# Patient Record
Sex: Female | Born: 1990
Health system: Southern US, Community
[De-identification: ages and names within clinical notes are randomized; demographics above are authoritative.]

## PROBLEM LIST (undated history)

## (undated) DIAGNOSIS — Z8742 Personal history of other diseases of the female genital tract: Secondary | ICD-10-CM

## (undated) DIAGNOSIS — N760 Acute vaginitis: Secondary | ICD-10-CM

## (undated) DIAGNOSIS — T7840XA Allergy, unspecified, initial encounter: Secondary | ICD-10-CM

## (undated) DIAGNOSIS — B9689 Other specified bacterial agents as the cause of diseases classified elsewhere: Secondary | ICD-10-CM

## (undated) HISTORY — DX: Other specified bacterial agents as the cause of diseases classified elsewhere: B96.89

## (undated) HISTORY — DX: Allergy, unspecified, initial encounter: T78.40XA

## (undated) HISTORY — DX: Other specified bacterial agents as the cause of diseases classified elsewhere: N76.0

## (undated) HISTORY — DX: Personal history of other diseases of the female genital tract: Z87.42

---

## 2018-05-07 DIAGNOSIS — Z23 Encounter for immunization: Secondary | ICD-10-CM | POA: Diagnosis not present

## 2018-06-05 DIAGNOSIS — F900 Attention-deficit hyperactivity disorder, predominantly inattentive type: Secondary | ICD-10-CM | POA: Diagnosis not present

## 2018-06-26 DIAGNOSIS — F900 Attention-deficit hyperactivity disorder, predominantly inattentive type: Secondary | ICD-10-CM | POA: Diagnosis not present

## 2018-10-09 DIAGNOSIS — H04122 Dry eye syndrome of left lacrimal gland: Secondary | ICD-10-CM | POA: Diagnosis not present

## 2018-10-15 DIAGNOSIS — R5383 Other fatigue: Secondary | ICD-10-CM | POA: Diagnosis not present

## 2018-10-15 DIAGNOSIS — F900 Attention-deficit hyperactivity disorder, predominantly inattentive type: Secondary | ICD-10-CM | POA: Diagnosis not present

## 2018-10-15 DIAGNOSIS — Z13220 Encounter for screening for lipoid disorders: Secondary | ICD-10-CM | POA: Diagnosis not present

## 2018-10-15 DIAGNOSIS — Z131 Encounter for screening for diabetes mellitus: Secondary | ICD-10-CM | POA: Diagnosis not present

## 2018-10-15 DIAGNOSIS — Z0000 Encounter for general adult medical examination without abnormal findings: Secondary | ICD-10-CM | POA: Diagnosis not present

## 2018-10-15 DIAGNOSIS — Z111 Encounter for screening for respiratory tuberculosis: Secondary | ICD-10-CM | POA: Diagnosis not present

## 2018-10-17 DIAGNOSIS — M542 Cervicalgia: Secondary | ICD-10-CM | POA: Diagnosis not present

## 2019-02-26 DIAGNOSIS — B001 Herpesviral vesicular dermatitis: Secondary | ICD-10-CM | POA: Diagnosis not present

## 2019-02-26 DIAGNOSIS — F900 Attention-deficit hyperactivity disorder, predominantly inattentive type: Secondary | ICD-10-CM | POA: Diagnosis not present

## 2019-03-07 DIAGNOSIS — Z23 Encounter for immunization: Secondary | ICD-10-CM | POA: Diagnosis not present

## 2019-04-04 DIAGNOSIS — Z23 Encounter for immunization: Secondary | ICD-10-CM | POA: Diagnosis not present

## 2019-04-21 ENCOUNTER — Other Ambulatory Visit: Payer: Self-pay | Admitting: Student

## 2019-04-21 ENCOUNTER — Other Ambulatory Visit (HOSPITAL_COMMUNITY): Payer: Self-pay | Admitting: Student

## 2019-04-21 DIAGNOSIS — R599 Enlarged lymph nodes, unspecified: Secondary | ICD-10-CM | POA: Diagnosis not present

## 2019-04-27 ENCOUNTER — Other Ambulatory Visit: Payer: Self-pay

## 2019-04-27 ENCOUNTER — Ambulatory Visit
Admission: RE | Admit: 2019-04-27 | Discharge: 2019-04-27 | Disposition: A | Discharge status: Home / Self Care | Payer: BC Managed Care – PPO | Source: Ambulatory Visit | Attending: Student | Admitting: Student

## 2019-04-27 DIAGNOSIS — R590 Localized enlarged lymph nodes: Secondary | ICD-10-CM | POA: Insufficient documentation

## 2019-04-27 DIAGNOSIS — R599 Enlarged lymph nodes, unspecified: Secondary | ICD-10-CM | POA: Insufficient documentation

## 2019-04-29 DIAGNOSIS — D225 Melanocytic nevi of trunk: Secondary | ICD-10-CM | POA: Diagnosis not present

## 2019-04-29 DIAGNOSIS — D2271 Melanocytic nevi of right lower limb, including hip: Secondary | ICD-10-CM | POA: Diagnosis not present

## 2019-04-29 DIAGNOSIS — D2372 Other benign neoplasm of skin of left lower limb, including hip: Secondary | ICD-10-CM | POA: Diagnosis not present

## 2019-04-29 DIAGNOSIS — D2272 Melanocytic nevi of left lower limb, including hip: Secondary | ICD-10-CM | POA: Diagnosis not present

## 2019-04-29 DIAGNOSIS — Z808 Family history of malignant neoplasm of other organs or systems: Secondary | ICD-10-CM | POA: Diagnosis not present

## 2019-04-29 DIAGNOSIS — D485 Neoplasm of uncertain behavior of skin: Secondary | ICD-10-CM | POA: Diagnosis not present

## 2019-04-29 DIAGNOSIS — L905 Scar conditions and fibrosis of skin: Secondary | ICD-10-CM | POA: Diagnosis not present

## 2019-05-05 DIAGNOSIS — F439 Reaction to severe stress, unspecified: Secondary | ICD-10-CM | POA: Diagnosis not present

## 2019-05-05 DIAGNOSIS — R071 Chest pain on breathing: Secondary | ICD-10-CM | POA: Diagnosis not present

## 2019-06-18 DIAGNOSIS — D2271 Melanocytic nevi of right lower limb, including hip: Secondary | ICD-10-CM | POA: Diagnosis not present

## 2019-10-29 ENCOUNTER — Encounter: Payer: Self-pay | Admitting: Family Medicine

## 2019-10-29 ENCOUNTER — Ambulatory Visit (INDEPENDENT_AMBULATORY_CARE_PROVIDER_SITE_OTHER): Payer: BLUE CROSS/BLUE SHIELD | Admitting: Family Medicine

## 2019-10-29 DIAGNOSIS — L237 Allergic contact dermatitis due to plants, except food: Secondary | ICD-10-CM | POA: Diagnosis not present

## 2019-10-29 DIAGNOSIS — Z975 Presence of (intrauterine) contraceptive device: Secondary | ICD-10-CM | POA: Diagnosis not present

## 2019-10-29 DIAGNOSIS — F900 Attention-deficit hyperactivity disorder, predominantly inattentive type: Secondary | ICD-10-CM | POA: Diagnosis not present

## 2019-10-29 DIAGNOSIS — Z8719 Personal history of other diseases of the digestive system: Secondary | ICD-10-CM | POA: Insufficient documentation

## 2019-10-29 DIAGNOSIS — Z8669 Personal history of other diseases of the nervous system and sense organs: Secondary | ICD-10-CM | POA: Insufficient documentation

## 2019-10-29 DIAGNOSIS — L259 Unspecified contact dermatitis, unspecified cause: Secondary | ICD-10-CM | POA: Insufficient documentation

## 2019-10-29 DIAGNOSIS — R590 Localized enlarged lymph nodes: Secondary | ICD-10-CM

## 2019-10-29 MED ORDER — PREDNISONE 10 MG PO TABS: 10.0000 mg | ORAL_TABLET | Freq: Every day | ORAL | 0 refills | Status: DC

## 2019-10-29 NOTE — Assessment & Plan Note (Signed)
Will start prednisone taper She may continue topical cortisone as well.  Follow up if not resolving.

## 2019-10-29 NOTE — Assessment & Plan Note (Signed)
She has concerns of weight gain and changes in migraine patterns related to current IUD.  Would like to have paragard She will schedule with GYN to discuss this.

## 2019-10-29 NOTE — Progress Notes (Signed)
Julie Brandt - 29 y.o. female MRN [id number]  Date of birth: 1990/06/17  Subjective Chief Complaint  Patient presents with  . Establish Care    HPI Julie Brandt is a 29 y.o. female here today for initial visit.  She has been in fairly good health.  She has a few concerns today including rash, problems with IUD, and ADHD.   She has had rash for about 1.5-2 week.  Located on L lateral leg as well as on arms.  She noticed after going hiking.  Initially has some bumps and vesicular appearing lesions.  Rash is very itchy.  She has tried cortisone cream and benadryl without much improvement.    She has [place] IUD placed a couple of years ago.  She is concerned about weight gain since having this placed.  She also has noticed that she is having aura of migraines several days per month but is not having headaches.  She thinks she may want to change to paragard.   Her ADHD is managed with adderall 10mg  daily as needed.  She doesn't always take each day.  She has been on medication since she was a child. Previously managed by primary care provider in [place].  She has not had side effects related to medication. She does not need refill at this time.   ROS:  A comprehensive ROS was completed and negative except as noted per HPI    Allergies  Allergen Reactions  . Cephalosporins Hives and Rash    No cross reactions with penicillins    Past Medical History:  Diagnosis Date  . BV (bacterial vaginosis)   . Hx of abnormal cervical Papanicolaou smear     History reviewed. No pertinent surgical history.  Social History   Socioeconomic History  . Marital status: Single    Spouse name: Not on file  . Number of children: Not on file  . Years of education: Not on file  . Highest education level: Not on file  Occupational History  . Occupation: [employer]  Tobacco Use  . Smoking status: Never Smoker  . Smokeless tobacco: Never Used  Vaping Use  . Vaping Use: Never used   Substance and Sexual Activity  . Alcohol use: Not on file  . Drug use: Never  . Sexual activity: Yes    Partners: Male  Other Topics Concern  . Not on file  Social History Narrative  . Not on file   Social Determinants of Health   Financial Resource Strain:   . Difficulty of Paying Living Expenses: Not on file  Food Insecurity:   . Worried About Doctor, general practice in the Last Year: Not on file  . Ran Out of Food in the Last Year: Not on file  Transportation Needs:   . Lack of Transportation (Medical): Not on file  . Lack of Transportation (Non-Medical): Not on file  Physical Activity:   . Days of Exercise per Week: Not on file  . Minutes of Exercise per Session: Not on file  Stress:   . Feeling of Stress : Not on file  Social Connections:   . Frequency of Communication with Friends and Family: Not on file  . Frequency of Social Gatherings with Friends and Family: Not on file  . Attends Religious Services: Not on file  . Active Member of Clubs or Organizations: Not on file  . Attends Programme researcher, broadcasting/film/video Meetings: Not on file  . Marital Status: Not on file    Family History  Problem Relation Age of Onset  . Hypertension Mother   . Skin cancer Mother   . Hypertension Maternal Grandfather   . Diabetes Maternal Grandfather   . Stroke Maternal Grandfather   . Skin cancer Maternal Grandfather   . Prostate cancer Maternal Grandfather   . Lung cancer Paternal Grandmother     Health Maintenance  Topic Date Due  . Hepatitis C Screening  Never done  . HIV Screening  Never done  . PAP SMEAR-Modifier  Never done  . INFLUENZA VACCINE  Never done  . TETANUS/TDAP  08/24/2027  . COVID-19 Vaccine  Completed     ----------------------------------------------------------------------------------------------------------------------------------------------------------------------------------------------------------------- Physical Exam BP 130/81 (BP Location: Left Arm, Patient  Position: Sitting, Cuff Size: Normal)   Pulse 78   Temp 98.6 F (37 C) (Oral)   Ht 5' 2.21" (1.58 m)   Wt 226 lb 12.8 oz (102.9 kg)   LMP 10/15/2019 (LMP Unknown)   SpO2 99%   BMI 41.21 kg/m   Physical Exam Constitutional:      Appearance: Normal appearance.  HENT:     Head: Normocephalic and atraumatic.  Eyes:     General: No scleral icterus. Cardiovascular:     Rate and Rhythm: Normal rate and regular rhythm.  Pulmonary:     Effort: Pulmonary effort is normal.     Breath sounds: Normal breath sounds.  Musculoskeletal:     Cervical back: Neck supple.  Skin:    Comments: Excoriated rash on L lower extremity   Neurological:     General: No focal deficit present.     Mental Status: She is alert.  Psychiatric:        Mood and Affect: Mood normal.        Behavior: Behavior normal.     ------------------------------------------------------------------------------------------------------------------------------------------------------------------------------------------------------------------- Assessment and Plan  Contact dermatitis Will start prednisone taper She may continue topical cortisone as well.  Follow up if not resolving.    Attention deficit hyperactivity disorder (ADHD), predominantly inattentive type She is doing well with adderall 10mg  daily as needed.  Discussed that I can take over prescribing of this.  Previous records and PDMP reviewed.    IUD (intrauterine device) in place She has concerns of weight gain and changes in migraine patterns related to current IUD.  Would like to have paragard She will schedule with GYN to discuss this.    Supraclavicular lymphadenopathy Minimally enlarged, non tender. Previous without concerning features.     Meds ordered this encounter  Medications  . predniSONE (DELTASONE) 10 MG tablet    Sig: Take 1 tablet (10 mg total) by mouth daily with breakfast. Take 40mg  x3 days, 30mg  x3 days, 20mg  x3 days, 10mg  x3  days, 5mg  x3 days.    Dispense:  32 tablet    Refill:  0    No follow-ups on file.    This visit occurred during the SARS-CoV-2 public health emergency.  Safety protocols were in place, including screening questions prior to the visit, additional usage of staff PPE, and extensive cleaning of exam room while observing appropriate contact time as indicated for disinfecting solutions.

## 2019-10-29 NOTE — Assessment & Plan Note (Signed)
Minimally enlarged, non tender. Previous US without concerning features.

## 2019-10-29 NOTE — Patient Instructions (Signed)
Great to meet you today! Take prednisone taper as directed for rash.  Let me know if not resolving.  Let me know when you need refill of adderall.  I'll need to see you every 3 months for this.

## 2019-10-29 NOTE — Assessment & Plan Note (Signed)
She is doing well with adderall 10mg  daily as needed.  Discussed that I can take over prescribing of this.  Previous records and PDMP reviewed.

## 2019-11-20 ENCOUNTER — Other Ambulatory Visit (HOSPITAL_COMMUNITY)
Admission: RE | Admit: 2019-11-20 | Discharge: 2019-11-20 | Disposition: A | Discharge status: Home / Self Care | Payer: BLUE CROSS/BLUE SHIELD | Source: Ambulatory Visit | Attending: Certified Nurse Midwife | Admitting: Certified Nurse Midwife

## 2019-11-20 ENCOUNTER — Ambulatory Visit (INDEPENDENT_AMBULATORY_CARE_PROVIDER_SITE_OTHER): Payer: BLUE CROSS/BLUE SHIELD | Admitting: Certified Nurse Midwife

## 2019-11-20 ENCOUNTER — Other Ambulatory Visit: Payer: Self-pay

## 2019-11-20 ENCOUNTER — Encounter: Payer: Self-pay | Admitting: Certified Nurse Midwife

## 2019-11-20 VITALS — BP 133/82 | HR 91 | Ht 62.0 in | Wt 233.0 lb

## 2019-11-20 DIAGNOSIS — Z975 Presence of (intrauterine) contraceptive device: Secondary | ICD-10-CM

## 2019-11-20 DIAGNOSIS — Z01419 Encounter for gynecological examination (general) (routine) without abnormal findings: Secondary | ICD-10-CM | POA: Diagnosis not present

## 2019-11-20 NOTE — Progress Notes (Signed)
Gynecology Annual Exam   History of Present Illness: Julie Brandt is a 29 y.o. single female presenting for an annual exam. She is concerned her IUD is causing some unwanted sx. Reports weight gain since insertion despite diet and exercise. Also reports aura w/o migraine around 13-15th of every month. States this was happening while on OCPs but had a migraine with it then. She did not have these sx while off birth control. Also concerned that recently dx papilledema may be associated with her IUD. [place] IUD was placed 09/2017. She is sexually active. Same partner 2 years. Declines STD testing. She denies dyspareunia. She does perform self breast exams. There is no notable family history of breast or ovarian cancer in her family. Hx of abnormal pap and colpo, had 2 normal paps since.   Past Medical History:  Past Medical History:  Diagnosis Date  . BV (bacterial vaginosis)   . Hx of abnormal cervical Papanicolaou smear     Past Surgical History:  History reviewed. No pertinent surgical history.  Gynecologic History:  LMP: Patient's last menstrual period was 10/15/2019 (lmp unknown). Average Interval: regular Heavy Menses: no Clots: no Intermenstrual Bleeding: no Postcoital Bleeding: no Dysmenorrhea: no Contraception: IUD Last Pap: completed on 09/2017 ; result was: NIL and HR HPV negative  Mammogram: n/a  Obstetric History: [id number]  Family History:  Family History  Problem Relation Age of Onset  . Hypertension Mother   . Skin cancer Mother   . Hypertension Maternal Grandfather   . Diabetes Maternal Grandfather   . Stroke Maternal Grandfather   . Skin cancer Maternal Grandfather   . Prostate cancer Maternal Grandfather   . Lung cancer Paternal Grandmother   . Parkinson's disease Father 37    Social History:  Social History   Socioeconomic History  . Marital status: Single    Spouse name: Not on file  . Number of children: Not on file  . Years of education: Not on file  .  Highest education level: Not on file  Occupational History  . Occupation: Doctor, general practice  Tobacco Use  . Smoking status: Never Smoker  . Smokeless tobacco: Never Used  Vaping Use  . Vaping Use: Never used  Substance and Sexual Activity  . Alcohol use: Not on file  . Drug use: Never  . Sexual activity: Yes    Partners: Male    Birth control/protection: I.U.D.  Other Topics Concern  . Not on file  Social History Narrative  . Not on file   Social Determinants of Health   Financial Resource Strain:   . Difficulty of Paying Living Expenses: Not on file  Food Insecurity:   . Worried About Programme researcher, broadcasting/film/video in the Last Year: Not on file  . Ran Out of Food in the Last Year: Not on file  Transportation Needs:   . Lack of Transportation (Medical): Not on file  . Lack of Transportation (Non-Medical): Not on file  Physical Activity:   . Days of Exercise per Week: Not on file  . Minutes of Exercise per Session: Not on file  Stress:   . Feeling of Stress : Not on file  Social Connections:   . Frequency of Communication with Friends and Family: Not on file  . Frequency of Social Gatherings with Friends and Family: Not on file  . Attends Religious Services: Not on file  . Active Member of Clubs or Organizations: Not on file  . Attends Banker Meetings: Not on file  .  Marital Status: Not on file  Intimate Partner Violence:   . Fear of Current or Ex-Partner: Not on file  . Emotionally Abused: Not on file  . Physically Abused: Not on file  . Sexually Abused: Not on file    Allergies:  Allergies  Allergen Reactions  . Cephalosporins Hives and Rash    No cross reactions with penicillins    Medications: Prior to Admission medications   Medication Sig Start Date End Date Taking? Authorizing Provider  amphetamine-dextroamphetamine (ADDERALL) 10 MG tablet Take by mouth. 02/26/19  Yes [provider]  cetirizine (ZYRTEC) 10 MG tablet Take 10 mg by mouth  daily.   Yes [provider]  levonorgestrel Rutha Bouchard) 19.5 MG IUD  08/27/17  Yes [provider]  diphenhydrAMINE (BENADRYL ALLERGY) 25 MG tablet     [provider]  Multiple Vitamin (MULTI-VITAMIN) tablet Take by mouth. Patient not taking: Reported on 11/20/2019    [provider]  predniSONE (DELTASONE) 10 MG tablet Take 1 tablet (10 mg total) by mouth daily with breakfast. Take 40mg  x3 days, 30mg  x3 days, 20mg  x3 days, 10mg  x3 days, 5mg  x3 days. Patient not taking: Reported on 11/20/2019 10/29/19   , DO    Review of Systems: negative except noted in HPI  Physical Exam Vitals: BP 133/82   Pulse 91   Ht 5\' 2"  (1.575 m)   Wt 233 lb (105.7 kg)   LMP 10/15/2019 (LMP Unknown)   BMI 42.62 kg/m  General: NAD HEENT: normocephalic, atraumatic Thyroid: no enlargement, no palpable nodules Pulmonary: Normal rate and effort, CTAB Cardiovascular: RRR Breast: Breast symmetrical, no tenderness, no palpable nodules or masses, no skin or nipple retraction present, no nipple discharge. No axillary or supraclavicular lymphadenopathy. Abdomen: soft, non-tender, non-distended. No hepatomegaly, splenomegaly or masses palpable. No evidence of hernia  Genitourinary:  External: Normal external female genitalia. Normal urethral meatus  Vagina: Normal vaginal mucosa, no evidence of prolapse   Cervix: Grossly normal in appearance, no bleeding, IUD string seen  Uterus: Non-enlarged, mobile, normal contour. No CMT  Adnexa: non-enlarged, no masses  Rectal: deferred Extremities: no edema, erythema, or tenderness Neurologic: Grossly intact Psychiatric: mood appropriate, affect full  Female chaperone present for pelvic and breast portions of the physical exam  Assessment:  1. Well woman exam   2. IUD (intrauterine device) in place   - counseled that her sx unlikely caused by IUD - she is interested in Paragard IUD; discussed mechanism of action, may increase  menstrual flow and pain - she will call back to schedule if she desires changing out IUD  Plan: Papsmear today Follow up with GYN in 1 year or prn Follow up with PCP as scheduled  , CNM 11/20/2019 10:19 AM

## 2019-11-20 NOTE — Progress Notes (Signed)
Pt has concerns about IUD

## 2019-11-21 DIAGNOSIS — R0981 Nasal congestion: Secondary | ICD-10-CM | POA: Diagnosis not present

## 2019-11-21 DIAGNOSIS — R5383 Other fatigue: Secondary | ICD-10-CM | POA: Diagnosis not present

## 2019-11-21 DIAGNOSIS — R519 Headache, unspecified: Secondary | ICD-10-CM | POA: Diagnosis not present

## 2019-11-23 LAB — CYTOLOGY - PAP
Comment: NEGATIVE
Diagnosis: NEGATIVE
High risk HPV: NEGATIVE

## 2019-12-14 ENCOUNTER — Encounter: Payer: BLUE CROSS/BLUE SHIELD | Admitting: Family Medicine

## 2019-12-17 ENCOUNTER — Other Ambulatory Visit: Payer: Self-pay

## 2019-12-17 ENCOUNTER — Ambulatory Visit (INDEPENDENT_AMBULATORY_CARE_PROVIDER_SITE_OTHER): Payer: BLUE CROSS/BLUE SHIELD | Admitting: Family Medicine

## 2019-12-17 ENCOUNTER — Encounter: Payer: Self-pay | Admitting: Family Medicine

## 2019-12-17 VITALS — BP 121/75 | HR 88 | Temp 97.9°F | Ht 61.81 in | Wt 235.3 lb

## 2019-12-17 DIAGNOSIS — Z111 Encounter for screening for respiratory tuberculosis: Secondary | ICD-10-CM | POA: Diagnosis not present

## 2019-12-17 DIAGNOSIS — Z23 Encounter for immunization: Secondary | ICD-10-CM | POA: Diagnosis not present

## 2019-12-17 DIAGNOSIS — Z13220 Encounter for screening for lipoid disorders: Secondary | ICD-10-CM

## 2019-12-17 DIAGNOSIS — Z0000 Encounter for general adult medical examination without abnormal findings: Secondary | ICD-10-CM | POA: Diagnosis not present

## 2019-12-17 NOTE — Assessment & Plan Note (Signed)
Well adult Orders Placed This Encounter  Procedures  . Flu Vaccine QUAD 6+ mos PF IM (Fluarix Quad PF)  . COMPLETE METABOLIC PANEL WITH GFR  . CBC  . Lipid Profile  . QuantiFERON-TB Gold Plus  Screening: TB, Lipid. Immunizations: flu vaccine Anticipatory guidance/Risk factor reduction:  Recommendations per AVS.

## 2019-12-17 NOTE — Progress Notes (Signed)
Julie Brandt - 29 y.o. female MRN [id number]  Date of birth: Mar 08, 1990  Subjective Chief Complaint  Patient presents with  . Annual Exam    HPI Julie Brandt is a 29 y.o. female here today for annual exam.  She has been in fairly good health.  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  She needs updated TB test.   She would like to have routine labs tests as well.   She is a non-smoker.  She has 1 glass of wine each night.    She walks some for exercise.   She had well woman exam with updated pap recently.    She will get flu vaccine today.  Has toenail that she hit about a year ago that she wants me to look at to be sure it doesn't appear to have fungal infection.     Review of Systems  Constitutional: Negative for chills, fever, malaise/fatigue and weight loss.  HENT: Negative for congestion, ear pain and sore throat.   Eyes: Negative for blurred vision, double vision and pain.  Respiratory: Negative for cough and shortness of breath.   Cardiovascular: Negative for chest pain and palpitations.  Gastrointestinal: Negative for abdominal pain, blood in stool, constipation, heartburn and nausea.  Genitourinary: Negative for dysuria and urgency.  Musculoskeletal: Negative for joint pain and myalgias.  Neurological: Negative for dizziness and headaches.  Endo/Heme/Allergies: Does not bruise/bleed easily.  Psychiatric/Behavioral: Negative for depression. The patient is not nervous/anxious and does not have insomnia.     Allergies  Allergen Reactions  . Cephalosporins Hives and Rash    No cross reactions with penicillins    Past Medical History:  Diagnosis Date  . BV (bacterial vaginosis)   . Hx of abnormal cervical Papanicolaou smear     History reviewed. No pertinent surgical history.  Social History   Socioeconomic History  . Marital status: Single    Spouse name: Not on file  . Number of children: Not on file  . Years of  education: Not on file  . Highest education level: Not on file  Occupational History  . Occupation: Doctor, general practice  Tobacco Use  . Smoking status: Never Smoker  . Smokeless tobacco: Never Used  Vaping Use  . Vaping Use: Never used  Substance and Sexual Activity  . Alcohol use: Not on file  . Drug use: Never  . Sexual activity: Yes    Partners: Male    Birth control/protection: I.U.D.  Other Topics Concern  . Not on file  Social History Narrative  . Not on file   Social Determinants of Health   Financial Resource Strain:   . Difficulty of Paying Living Expenses: Not on file  Food Insecurity:   . Worried About Programme researcher, broadcasting/film/video in the Last Year: Not on file  . Ran Out of Food in the Last Year: Not on file  Transportation Needs:   . Lack of Transportation (Medical): Not on file  . Lack of Transportation (Non-Medical): Not on file  Physical Activity:   . Days of Exercise per Week: Not on file  . Minutes of Exercise per Session: Not on file  Stress:   . Feeling of Stress : Not on file  Social Connections:   . Frequency of Communication with Friends and Family: Not on file  . Frequency of Social Gatherings with Friends and Family: Not on file  . Attends Religious Services: Not on file  . Active Member  of Clubs or Organizations: Not on file  . Attends Banker Meetings: Not on file  . Marital Status: Not on file    Family History  Problem Relation Age of Onset  . Hypertension Mother   . Skin cancer Mother   . Hypertension Maternal Grandfather   . Diabetes Maternal Grandfather   . Stroke Maternal Grandfather   . Skin cancer Maternal Grandfather   . Prostate cancer Maternal Grandfather   . Lung cancer Paternal Grandmother   . Parkinson's disease Father 110    Health Maintenance  Topic Date Due  . Hepatitis C Screening  Never done  . HIV Screening  Never done  . INFLUENZA VACCINE  Never done  . PAP SMEAR-Modifier  11/20/2022  . TETANUS/TDAP   08/24/2027  . COVID-19 Vaccine  Completed     ----------------------------------------------------------------------------------------------------------------------------------------------------------------------------------------------------------------- Physical Exam BP 121/75 (BP Location: Left Arm, Patient Position: Sitting, Cuff Size: Large)   Pulse 88   Temp 97.9 F (36.6 C)   Ht 5' 1.81" (1.57 m)   Wt 235 lb 4.8 oz (106.7 kg)   SpO2 97%   BMI 43.30 kg/m   Physical Exam Constitutional:      General: She is not in acute distress. HENT:     Head: Normocephalic and atraumatic.     Right Ear: Tympanic membrane normal.     Left Ear: Tympanic membrane normal.     Nose: Nose normal.  Eyes:     General: No scleral icterus.    Conjunctiva/sclera: Conjunctivae normal.  Neck:     Thyroid: No thyromegaly.  Cardiovascular:     Rate and Rhythm: Normal rate and regular rhythm.     Heart sounds: Normal heart sounds.  Pulmonary:     Effort: Pulmonary effort is normal.     Breath sounds: Normal breath sounds.  Abdominal:     General: Bowel sounds are normal. There is no distension.     Palpations: Abdomen is soft.     Tenderness: There is no abdominal tenderness. There is no guarding.  Musculoskeletal:        General: Normal range of motion.     Cervical back: Normal range of motion and neck supple.  Lymphadenopathy:     Cervical: No cervical adenopathy.  Skin:    General: Skin is warm and dry.     Findings: No rash.     Comments: Distal nail of L great nail slightly dystrophic without thickening of the nail.   Neurological:     General: No focal deficit present.     Mental Status: She is alert and oriented to person, place, and time.     Cranial Nerves: No cranial nerve deficit.     Coordination: Coordination normal.  Psychiatric:        Mood and Affect: Mood normal.        Behavior: Behavior normal.      ------------------------------------------------------------------------------------------------------------------------------------------------------------------------------------------------------------------- Assessment and Plan  Well adult exam Well adult Orders Placed This Encounter  Procedures  . Flu Vaccine QUAD 6+ mos PF IM (Fluarix Quad PF)  . COMPLETE METABOLIC PANEL WITH GFR  . CBC  . Lipid Profile  . QuantiFERON-TB Gold Plus  Screening: TB, Lipid. Immunizations: flu vaccine Anticipatory guidance/Risk factor reduction:  Recommendations per AVS.     No orders of the defined types were placed in this encounter.   No follow-ups on file.    This visit occurred during the SARS-CoV-2 public health emergency.  Safety protocols were in place, including  screening questions prior to the visit, additional usage of staff PPE, and extensive cleaning of exam room while observing appropriate contact time as indicated for disinfecting solutions.

## 2019-12-17 NOTE — Patient Instructions (Signed)
Preventive Care 21-31 Years Old, Female Preventive care refers to visits with your health care provider and lifestyle choices that can promote health and wellness. This includes:  A yearly physical exam. This may also be called an annual well check.  Regular dental visits and eye exams.  Immunizations.  Screening for certain conditions.  Healthy lifestyle choices, such as eating a healthy diet, getting regular exercise, not using drugs or products that contain nicotine and tobacco, and limiting alcohol use. What can I expect for my preventive care visit? Physical exam Your health care provider will check your:  Height and weight. This may be used to calculate body mass index (BMI), which tells if you are at a healthy weight.  Heart rate and blood pressure.  Skin for abnormal spots. Counseling Your health care provider may ask you questions about your:  Alcohol, tobacco, and drug use.  Emotional well-being.  Home and relationship well-being.  Sexual activity.  Eating habits.  Work and work environment.  Method of birth control.  Menstrual cycle.  Pregnancy history. What immunizations do I need?  Influenza (flu) vaccine  This is recommended every year. Tetanus, diphtheria, and pertussis (Tdap) vaccine  You may need a Td booster every 10 years. Varicella (chickenpox) vaccine  You may need this if you have not been vaccinated. Human papillomavirus (HPV) vaccine  If recommended by your health care provider, you may need three doses over 6 months. Measles, mumps, and rubella (MMR) vaccine  You may need at least one dose of MMR. You may also need a second dose. Meningococcal conjugate (MenACWY) vaccine  One dose is recommended if you are age 19-21 years and a first-year college student living in a residence hall, or if you have one of several medical conditions. You may also need additional booster doses. Pneumococcal conjugate (PCV13) vaccine  You may need  this if you have certain conditions and were not previously vaccinated. Pneumococcal polysaccharide (PPSV23) vaccine  You may need one or two doses if you smoke cigarettes or if you have certain conditions. Hepatitis A vaccine  You may need this if you have certain conditions or if you travel or work in places where you may be exposed to hepatitis A. Hepatitis B vaccine  You may need this if you have certain conditions or if you travel or work in places where you may be exposed to hepatitis B. Haemophilus influenzae type b (Hib) vaccine  You may need this if you have certain conditions. You may receive vaccines as individual doses or as more than one vaccine together in one shot (combination vaccines). Talk with your health care provider about the risks and benefits of combination vaccines. What tests do I need?  Blood tests  Lipid and cholesterol levels. These may be checked every 5 years starting at age 20.  Hepatitis C test.  Hepatitis B test. Screening  Diabetes screening. This is done by checking your blood sugar (glucose) after you have not eaten for a while (fasting).  Sexually transmitted disease (STD) testing.  BRCA-related cancer screening. This may be done if you have a family history of breast, ovarian, tubal, or peritoneal cancers.  Pelvic exam and Pap test. This may be done every 3 years starting at age 21. Starting at age 30, this may be done every 5 years if you have a Pap test in combination with an HPV test. Talk with your health care provider about your test results, treatment options, and if necessary, the need for more tests.   Follow these instructions at home: Eating and drinking   Eat a diet that includes fresh fruits and vegetables, whole grains, lean protein, and low-fat dairy.  Take vitamin and mineral supplements as recommended by your health care provider.  Do not drink alcohol if: ? Your health care provider tells you not to drink. ? You are  pregnant, may be pregnant, or are planning to become pregnant.  If you drink alcohol: ? Limit how much you have to 0-1 drink a day. ? Be aware of how much alcohol is in your drink. In the [place] one drink equals one 12 oz bottle of beer (355 mL), one 5 oz glass of wine (148 mL), or one 1 oz glass of hard liquor (44 mL). Lifestyle  Take daily care of your teeth and gums.  Stay active. Exercise for at least 30 minutes on 5 or more days each week.  Do not use any products that contain nicotine or tobacco, such as cigarettes, e-cigarettes, and chewing tobacco. If you need help quitting, ask your health care provider.  If you are sexually active, practice safe sex. Use a condom or other form of birth control (contraception) in order to prevent pregnancy and STIs (sexually transmitted infections). If you plan to become pregnant, see your health care provider for a preconception visit. What's next?  Visit your health care provider once a year for a well check visit.  Ask your health care provider how often you should have your eyes and teeth checked.  Stay up to date on all vaccines. This information is not intended to replace advice given to you by your health care provider. Make sure you discuss any questions you have with your health care provider. Document Revised: 10/17/2017 Document Reviewed: 10/17/2017 Elsevier Patient Education  2020 [employer].

## 2019-12-21 DIAGNOSIS — Z111 Encounter for screening for respiratory tuberculosis: Secondary | ICD-10-CM | POA: Diagnosis not present

## 2019-12-21 DIAGNOSIS — Z13220 Encounter for screening for lipoid disorders: Secondary | ICD-10-CM | POA: Diagnosis not present

## 2019-12-21 DIAGNOSIS — Z0000 Encounter for general adult medical examination without abnormal findings: Secondary | ICD-10-CM | POA: Diagnosis not present

## 2019-12-23 LAB — CBC
HCT: 38.2 % (ref 35.0–45.0)
Hemoglobin: 12.9 g/dL (ref 11.7–15.5)
MCH: 29.7 pg (ref 27.0–33.0)
MCHC: 33.8 g/dL (ref 32.0–36.0)
MCV: 88 fL (ref 80.0–100.0)
MPV: 10.9 fL (ref 7.5–12.5)
Platelets: 221 10*3/uL (ref 140–400)
RBC: 4.34 10*6/uL (ref 3.80–5.10)
RDW: 12.3 % (ref 11.0–15.0)
WBC: 4.9 10*3/uL (ref 3.8–10.8)

## 2019-12-23 LAB — COMPLETE METABOLIC PANEL WITH GFR
AG Ratio: 1.7 (calc) (ref 1.0–2.5)
ALT: 13 U/L (ref 6–29)
AST: 16 U/L (ref 10–30)
Albumin: 4 g/dL (ref 3.6–5.1)
Alkaline phosphatase (APISO): 87 U/L (ref 31–125)
BUN: 9 mg/dL (ref 7–25)
CO2: 26 mmol/L (ref 20–32)
Calcium: 9.1 mg/dL (ref 8.6–10.2)
Chloride: 105 mmol/L (ref 98–110)
Creat: 0.82 mg/dL (ref 0.50–1.10)
GFR, Est African American: 111 mL/min/{1.73_m2} (ref >=60)
GFR, Est Non African American: 96 mL/min/{1.73_m2} (ref >=60)
Globulin: 2.4 g/dL (calc) (ref 1.9–3.7)
Glucose, Bld: 94 mg/dL (ref 65–99)
Potassium: 4.1 mmol/L (ref 3.5–5.3)
Sodium: 137 mmol/L (ref 135–146)
Total Bilirubin: 0.8 mg/dL (ref 0.2–1.2)
Total Protein: 6.4 g/dL (ref 6.1–8.1)

## 2019-12-23 LAB — QUANTIFERON-TB GOLD PLUS
Mitogen-NIL: >10 IU/mL
NIL: 0.02 IU/mL
QuantiFERON-TB Gold Plus: NEGATIVE
TB1-NIL: 0 IU/mL
TB2-NIL: 0 IU/mL

## 2019-12-23 LAB — LIPID PANEL
Cholesterol: 184 mg/dL (ref <200)
HDL: 47 mg/dL — ABNORMAL LOW (ref >=50)
LDL Cholesterol (Calc): 109 mg/dL (calc) — ABNORMAL HIGH
Non-HDL Cholesterol (Calc): 137 mg/dL (calc) — ABNORMAL HIGH (ref <130)
Total CHOL/HDL Ratio: 3.9 (calc) (ref <5.0)
Triglycerides: 160 mg/dL — ABNORMAL HIGH (ref <150)

## 2020-02-04 ENCOUNTER — Encounter: Payer: Self-pay | Admitting: Family Medicine

## 2020-02-04 ENCOUNTER — Ambulatory Visit (INDEPENDENT_AMBULATORY_CARE_PROVIDER_SITE_OTHER): Payer: BLUE CROSS/BLUE SHIELD | Admitting: Family Medicine

## 2020-02-04 ENCOUNTER — Other Ambulatory Visit: Payer: Self-pay

## 2020-02-04 DIAGNOSIS — F900 Attention-deficit hyperactivity disorder, predominantly inattentive type: Secondary | ICD-10-CM | POA: Diagnosis not present

## 2020-02-04 MED ORDER — AMPHETAMINE-DEXTROAMPHETAMINE 10 MG PO TABS: 10.0000 mg | ORAL_TABLET | Freq: Every day | ORAL | 0 refills | Status: DC

## 2020-02-04 MED ORDER — AMPHETAMINE-DEXTROAMPHETAMINE 10 MG PO TABS
10.0000 mg | ORAL_TABLET | Freq: Every day | ORAL | 0 refills | Status: DC
Start: 2020-02-04 — End: 2020-04-08

## 2020-02-04 NOTE — Progress Notes (Signed)
Julie Brandt - 29 y.o. female MRN [id number]  Date of birth: 07/11/1990  Subjective Chief Complaint  Patient presents with  . Medication Refill    HPI Julie Brandt is a 29 y.o. female here today for follow up of ADHD.  She was diagnosed with ADHD when she was in 3rd grade.  Initially tried on Ritalin but this did not work for her.  Changed to adderall and she has continued on this since that time.  She has been off for about 11 months because she didn't think she needed this after completing studying for exams.  She has since found that she is having some difficulty since starting her new job.  She does note a mild jittery feeling when first taking the medication but overall is tolerating well.  She tried changing to extended release previously but had insomnia with this.    ROS:  A comprehensive ROS was completed and negative except as noted per HPI  Allergies  Allergen Reactions  . Cephalosporins Hives and Rash    No cross reactions with penicillins    Past Medical History:  Diagnosis Date  . BV (bacterial vaginosis)   . Hx of abnormal cervical Papanicolaou smear     History reviewed. No pertinent surgical history.  Social History   Socioeconomic History  . Marital status: Single    Spouse name: Not on file  . Number of children: Not on file  . Years of education: Not on file  . Highest education level: Not on file  Occupational History  . Occupation: Doctor, general practice  Tobacco Use  . Smoking status: Never Smoker  . Smokeless tobacco: Never Used  Vaping Use  . Vaping Use: Never used  Substance and Sexual Activity  . Alcohol use: Not on file  . Drug use: Never  . Sexual activity: Yes    Partners: Male    Birth control/protection: I.U.D.  Other Topics Concern  . Not on file  Social History Narrative  . Not on file   Social Determinants of Health   Financial Resource Strain: Not on file  Food Insecurity: Not on file  Transportation Needs: Not on file  Physical  Activity: Not on file  Stress: Not on file  Social Connections: Not on file    Family History  Problem Relation Age of Onset  . Hypertension Mother   . Skin cancer Mother   . Hypertension Maternal Grandfather   . Diabetes Maternal Grandfather   . Stroke Maternal Grandfather   . Skin cancer Maternal Grandfather   . Prostate cancer Maternal Grandfather   . Lung cancer Paternal Grandmother   . Parkinson's disease Father 36    Health Maintenance  Topic Date Due  . Hepatitis C Screening  Never done  . HIV Screening  Never done  . PAP SMEAR-Modifier  11/20/2022  . TETANUS/TDAP  08/24/2027  . INFLUENZA VACCINE  Completed  . COVID-19 Vaccine  Completed     ----------------------------------------------------------------------------------------------------------------------------------------------------------------------------------------------------------------- Physical Exam BP 106/77   Pulse 83   Temp 97.8 F (36.6 C)   Wt 232 lb 8 oz (105.5 kg)   SpO2 99%   BMI 42.79 kg/m   Physical Exam Constitutional:      Appearance: Normal appearance.  Eyes:     General: No scleral icterus. Neurological:     General: No focal deficit present.     Mental Status: She is alert.  Psychiatric:        Mood and Affect: Mood normal.  Behavior: Behavior normal.     ------------------------------------------------------------------------------------------------------------------------------------------------------------------------------------------------------------------- Assessment and Plan  Attention deficit hyperactivity disorder (ADHD), predominantly inattentive type She has done well with adderall 10mg  daily.  Mild side effects but tolerable.  Will restart adderall.  PDMP reviewed.  F/u in 3 months.    Meds ordered this encounter  Medications  . amphetamine-dextroamphetamine (ADDERALL) 10 MG tablet    Sig: Take 1 tablet (10 mg total) by mouth daily with breakfast.     Dispense:  30 tablet    Refill:  0    Fill now  . amphetamine-dextroamphetamine (ADDERALL) 10 MG tablet    Sig: Take 1 tablet (10 mg total) by mouth daily with breakfast.    Dispense:  30 tablet    Refill:  0    Fill in 60 days  . amphetamine-dextroamphetamine (ADDERALL) 10 MG tablet    Sig: Take 1 tablet (10 mg total) by mouth daily with breakfast. Fill in 30 days    Dispense:  30 tablet    Refill:  0    Return in about 3 months (around 05/04/2020) for ADHD.    This visit occurred during the SARS-CoV-2 public health emergency.  Safety protocols were in place, including screening questions prior to the visit, additional usage of staff PPE, and extensive cleaning of exam room while observing appropriate contact time as indicated for disinfecting solutions.

## 2020-02-04 NOTE — Assessment & Plan Note (Signed)
She has done well with adderall 10mg  daily.  Mild side effects but tolerable.  Will restart adderall.  PDMP reviewed.  F/u in 3 months.

## 2020-04-08 ENCOUNTER — Encounter: Payer: Self-pay | Admitting: Family Medicine

## 2020-04-08 ENCOUNTER — Other Ambulatory Visit: Payer: Self-pay | Admitting: Family Medicine

## 2020-04-08 MED ORDER — AMPHETAMINE-DEXTROAMPHETAMINE 10 MG PO TABS
10.0000 mg | ORAL_TABLET | Freq: Every day | ORAL | 0 refills | Status: DC
Start: 2020-04-08 — End: 2020-05-23

## 2020-05-04 ENCOUNTER — Ambulatory Visit: Payer: BLUE CROSS/BLUE SHIELD | Admitting: Family Medicine

## 2020-05-23 ENCOUNTER — Ambulatory Visit (INDEPENDENT_AMBULATORY_CARE_PROVIDER_SITE_OTHER): Payer: Managed Care, Other (non HMO) | Admitting: Family Medicine

## 2020-05-23 ENCOUNTER — Encounter: Payer: Self-pay | Admitting: Family Medicine

## 2020-05-23 VITALS — BP 141/87 | HR 92 | Temp 97.9°F | Ht 62.0 in | Wt 237.0 lb

## 2020-05-23 DIAGNOSIS — F900 Attention-deficit hyperactivity disorder, predominantly inattentive type: Secondary | ICD-10-CM | POA: Diagnosis not present

## 2020-05-23 DIAGNOSIS — F411 Generalized anxiety disorder: Secondary | ICD-10-CM

## 2020-05-23 MED ORDER — AMPHETAMINE-DEXTROAMPHET ER 10 MG PO CP24: 10.0000 mg | ORAL_CAPSULE | Freq: Every day | ORAL | 0 refills | Status: DC

## 2020-05-23 NOTE — Progress Notes (Signed)
Julie Brandt - 30 y.o. female MRN [id number]  Date of birth: February 05, 1991  Subjective Chief Complaint  Patient presents with  . Anxiety  . Depression    HPI  Julie Brandt is a 30 y.o. female here today for follow-up of ADD.  She also has some concerns about depression and anxiety.  ADD is currently managed with Adderall 10 mg use IR once daily.  This typically last 4 to 6 hours however she feels that she could benefit more from an extended release formulation.  She does well through the first portion of her day however when it comes to charting in the afternoon and evenings she has difficulty focusing on this.  She has not noted a significant side effects from medication.  She also has noted some symptoms of depression and anxiety.  She feels like this is related to her current job however she is leaving this position within the next couple of weeks.  She is hopeful that the symptoms will improve with transitioning to a new job.  She would be interested in counseling however she is not sure she wants to pursue starting medication at this time.  Depression screen [facility] 2/9 05/23/2020 02/04/2020 12/17/2019  Decreased Interest 2 1 1   Down, Depressed, Hopeless 3 1 1   PHQ - 2 Score 5 2 2   Altered sleeping 1 0 -  Tired, decreased energy 3 1 -  Change in appetite 2 0 -  Feeling bad or failure about yourself  3 1 -  Trouble concentrating 3 0 -  Moving slowly or fidgety/restless 0 0 -  Suicidal thoughts 0 0 -  PHQ-9 Score 17 4 -  Difficult doing work/chores Very difficult Somewhat difficult -   GAD 7 : Generalized Anxiety Score 05/23/2020 02/04/2020 10/29/2019  Nervous, Anxious, on Edge 3 2 1   Control/stop worrying 2 1 1   Worry too much - different things 3 1 1   Trouble relaxing 2 2 0  Restless 1 0 0  Easily annoyed or irritable 3 2 1   Afraid - awful might happen 3 0 1  Total GAD 7 Score 17 8 5   Anxiety Difficulty Very difficult - Somewhat difficult      Allergies  Allergen Reactions  .  Cephalosporins Hives and Rash    No cross reactions with penicillins    Past Medical History:  Diagnosis Date  . BV (bacterial vaginosis)   . Hx of abnormal cervical Papanicolaou smear     History reviewed. No pertinent surgical history.  Social History   Socioeconomic History  . Marital status: Single    Spouse name: Not on file  . Number of children: Not on file  . Years of education: Not on file  . Highest education level: Not on file  Occupational History  . Occupation: 07/23/2020  Tobacco Use  . Smoking status: Never Smoker  . Smokeless tobacco: Never Used  Vaping Use  . Vaping Use: Never used  Substance and Sexual Activity  . Alcohol use: Not on file  . Drug use: Never  . Sexual activity: Yes    Partners: Male    Birth control/protection: I.U.D.  Other Topics Concern  . Not on file  Social History Narrative  . Not on file   Social Determinants of Health   Financial Resource Strain: Not on file  Food Insecurity: Not on file  Transportation Needs: Not on file  Physical Activity: Not on file  Stress: Not on file  Social Connections: Not on file  Family History  Problem Relation Age of Onset  . Hypertension Mother   . Skin cancer Mother   . Hypertension Maternal Grandfather   . Diabetes Maternal Grandfather   . Stroke Maternal Grandfather   . Skin cancer Maternal Grandfather   . Prostate cancer Maternal Grandfather   . Lung cancer Paternal Grandmother   . Parkinson's disease Father 47    Health Maintenance  Topic Date Due  . Hepatitis C Screening  Never done  . HIV Screening  Never done  . COVID-19 Vaccine (3 - Booster for Moderna series) 10/02/2019  . INFLUENZA VACCINE  09/19/2020  . PAP SMEAR-Modifier  11/20/2022  . TETANUS/TDAP  08/24/2027  . HPV VACCINES  Aged Out      ----------------------------------------------------------------------------------------------------------------------------------------------------------------------------------------------------------------- Physical Exam BP (!) 141/87 (BP Location: Left Arm, Patient Position: Sitting, Cuff Size: Large)   Pulse 92   Temp 97.9 F (36.6 C)   Ht 5\' 2"  (1.575 m)   Wt 237 lb (107.5 kg)   SpO2 99%   BMI 43.35 kg/m   Physical Exam Constitutional:      Appearance: Normal appearance.  HENT:     Head: Normocephalic and atraumatic.  Eyes:     General: No scleral icterus. Musculoskeletal:     Cervical back: Neck supple.  Neurological:     General: No focal deficit present.     Mental Status: She is alert.  Psychiatric:        Mood and Affect: Mood normal.        Behavior: Behavior normal.     ------------------------------------------------------------------------------------------------------------------------------------------------------------------------------------------------------------------- Assessment and Plan  Attention deficit hyperactivity disorder (ADHD), predominantly inattentive type Current dose of Adderall is working fairly well for her however not lasting throughout the day.  Will change to Adderall extended release of 10 mg daily and titrate as needed.  She will let me know how she is doing with this after a couple weeks.   GAD (generalized anxiety disorder) Predominantly anxiety related symptoms with some depressive features.  She would like to hold off on medication as she feels like her current job is likely contributing.  She will be leaving this position soon.  She would be interested in seeing a therapist and I placed a referral for her to get scheduled for this.   Meds ordered this encounter  Medications  . amphetamine-dextroamphetamine (ADDERALL XR) 10 MG 24 hr capsule    Sig: Take 1 capsule (10 mg total) by mouth daily.    Dispense:  30 capsule     Refill:  0    Return in about 6 weeks (around 07/04/2020) for f/u anxiety/add.    This visit occurred during the SARS-CoV-2 public health emergency.  Safety protocols were in place, including screening questions prior to the visit, additional usage of staff PPE, and extensive cleaning of exam room while observing appropriate contact time as indicated for disinfecting solutions.

## 2020-05-23 NOTE — Assessment & Plan Note (Signed)
Current dose of Adderall is working fairly well for her however not lasting throughout the day.  Will change to Adderall extended release of 10 mg daily and titrate as needed.  She will let me know how she is doing with this after a couple weeks.

## 2020-05-23 NOTE — Assessment & Plan Note (Signed)
Predominantly anxiety related symptoms with some depressive features.  She would like to hold off on medication as she feels like her current job is likely contributing.  She will be leaving this position soon.  She would be interested in seeing a therapist and I placed a referral for her to get scheduled for this.

## 2020-05-23 NOTE — Patient Instructions (Signed)
Try the adderall XR at 10mg  daily.  Let's follow up in about 6 weeks for anxiety and medication follow up.

## 2020-07-04 ENCOUNTER — Encounter: Payer: Self-pay | Admitting: Family Medicine

## 2020-07-04 ENCOUNTER — Other Ambulatory Visit: Payer: Self-pay

## 2020-07-04 ENCOUNTER — Ambulatory Visit: Payer: Managed Care, Other (non HMO) | Admitting: Family Medicine

## 2020-07-04 DIAGNOSIS — F411 Generalized anxiety disorder: Secondary | ICD-10-CM

## 2020-07-04 DIAGNOSIS — F900 Attention-deficit hyperactivity disorder, predominantly inattentive type: Secondary | ICD-10-CM | POA: Diagnosis not present

## 2020-07-04 NOTE — Progress Notes (Signed)
Julie Brandt - 30 y.o. female MRN [id number]  Date of birth: 11-Sep-1990  Subjective Chief Complaint  Patient presents with  . Anxiety    HPI  Julie Brandt is a 30 y.o. female here today for follow up of ADD and anxiety.  She was changed from adderall IR to adderall xr at previous appointment which she feels works better.  She is transitioning between jobs right now and really isn't taking the medication during this time.  She has not noted any significant side effects related to the medication including palpitations, anxiety, or sleep difficulty.  She actually feels that this has improved her anxiety some.  She still doesn't think she is at a point where she would like to try medication for anxiety.  Referral placed for a therapist but hasn't scheduled yet.   Depression screen [facility] 2/9 07/04/2020 05/23/2020 02/04/2020  Decreased Interest 1 2 1   Down, Depressed, Hopeless 1 3 1   PHQ - 2 Score 2 5 2   Altered sleeping 2 1 0  Tired, decreased energy 2 3 1   Change in appetite 0 2 0  Feeling bad or failure about yourself  2 3 1   Trouble concentrating 1 3 0  Moving slowly or fidgety/restless 0 0 0  Suicidal thoughts 0 0 0  PHQ-9 Score 9 17 4   Difficult doing work/chores Somewhat difficult Very difficult Somewhat difficult   GAD 7 : Generalized Anxiety Score 07/04/2020 05/23/2020 02/04/2020 10/29/2019  Nervous, Anxious, on Edge 2 3 2 1   Control/stop worrying 1 2 1 1   Worry too much - different things 2 3 1 1   Trouble relaxing 2 2 2  0  Restless 1 1 0 0  Easily annoyed or irritable 3 3 2 1   Afraid - awful might happen 2 3 0 1  Total GAD 7 Score 13 17 8 5   Anxiety Difficulty Somewhat difficult Very difficult - Somewhat difficult   ROS:  A comprehensive ROS was completed and negative except as noted per HPI     Allergies  Allergen Reactions  . Cephalosporins Hives and Rash    No cross reactions with penicillins    Past Medical History:  Diagnosis Date  . BV (bacterial vaginosis)   . Hx of  abnormal cervical Papanicolaou smear     History reviewed. No pertinent surgical history.  Social History   Socioeconomic History  . Marital status: Single    Spouse name: Not on file  . Number of children: Not on file  . Years of education: Not on file  . Highest education level: Not on file  Occupational History  . Occupation:  Tobacco Use  . Smoking status: Never Smoker  . Smokeless tobacco: Never Used  Vaping Use  . Vaping Use: Never used  Substance and Sexual Activity  . Alcohol use: Not on file  . Drug use: Never  . Sexual activity: Yes    Partners: Male    Birth control/protection: I.U.D.  Other Topics Concern  . Not on file  Social History Narrative  . Not on file   Social Determinants of Health   Financial Resource Strain: Not on file  Food Insecurity: Not on file  Transportation Needs: Not on file  Physical Activity: Not on file  Stress: Not on file  Social Connections: Not on file    Family History  Problem Relation Age of Onset  . Hypertension Mother   . Skin cancer Mother   . Hypertension Maternal Grandfather   . Diabetes Maternal  Grandfather   . Stroke Maternal Grandfather   . Skin cancer Maternal Grandfather   . Prostate cancer Maternal Grandfather   . Lung cancer Paternal Grandmother   . Parkinson's disease Father 15    Health Maintenance  Topic Date Due  . HIV Screening  Never done  . Hepatitis C Screening  Never done  . COVID-19 Vaccine (3 - Booster for Moderna series) 09/01/2019  . INFLUENZA VACCINE  09/19/2020  . PAP SMEAR-Modifier  11/20/2022  . TETANUS/TDAP  08/24/2027  . HPV VACCINES  Aged Out     ----------------------------------------------------------------------------------------------------------------------------------------------------------------------------------------------------------------- Physical Exam BP 119/77 (BP Location: Left Arm, Patient Position: Sitting, Cuff Size: Large)   Pulse 77    Temp 97.6 F (36.4 C) (Oral)   Ht 5\' 2"  (1.575 m)   Wt 238 lb (108 kg)   SpO2 95%   BMI 43.53 kg/m   Physical Exam Constitutional:      Appearance: Normal appearance.  HENT:     Head: Normocephalic and atraumatic.  Musculoskeletal:     Cervical back: Neck supple.  Neurological:     Mental Status: She is alert.  Psychiatric:        Mood and Affect: Mood normal.        Behavior: Behavior normal.     ------------------------------------------------------------------------------------------------------------------------------------------------------------------------------------------------------------------- Assessment and Plan  Attention deficit hyperactivity disorder (ADHD), predominantly inattentive type She has done well with change to extended release adderall.  We'll continue at current strength.  She will let me know when she needs a refill.   GAD (generalized anxiety disorder) She would like to hold off on medication at this time but we did discuss options.  Would recommend lexapro and she will let me know if she decides she wants to try this.  She will contact therapist that we referred her to and set up an appointment.     No orders of the defined types were placed in this encounter.   No follow-ups on file.    This visit occurred during the SARS-CoV-2 public health emergency.  Safety protocols were in place, including screening questions prior to the visit, additional usage of staff PPE, and extensive cleaning of exam room while observing appropriate contact time as indicated for disinfecting solutions.

## 2020-07-04 NOTE — Assessment & Plan Note (Signed)
She would like to hold off on medication at this time but we did discuss options.  Would recommend lexapro and she will let me know if she decides she wants to try this.  She will contact therapist that we referred her to and set up an appointment.

## 2020-07-04 NOTE — Assessment & Plan Note (Signed)
She has done well with change to extended release adderall.  We'll continue at current strength.  She will let me know when she needs a refill.

## 2020-08-08 ENCOUNTER — Encounter: Payer: Self-pay | Admitting: Family Medicine

## 2020-08-09 ENCOUNTER — Other Ambulatory Visit: Payer: Self-pay | Admitting: Family Medicine

## 2020-08-09 MED ORDER — ESCITALOPRAM OXALATE 10 MG PO TABS: 10.0000 mg | ORAL_TABLET | Freq: Every day | ORAL | 3 refills | Status: DC

## 2020-08-30 ENCOUNTER — Ambulatory Visit (INDEPENDENT_AMBULATORY_CARE_PROVIDER_SITE_OTHER): Payer: 59 | Admitting: Psychologist

## 2020-08-30 DIAGNOSIS — F320 Major depressive disorder, single episode, mild: Secondary | ICD-10-CM

## 2020-08-30 DIAGNOSIS — F411 Generalized anxiety disorder: Secondary | ICD-10-CM

## 2020-09-06 ENCOUNTER — Ambulatory Visit (INDEPENDENT_AMBULATORY_CARE_PROVIDER_SITE_OTHER): Payer: 59 | Admitting: Psychologist

## 2020-09-06 DIAGNOSIS — F320 Major depressive disorder, single episode, mild: Secondary | ICD-10-CM | POA: Diagnosis not present

## 2020-09-06 DIAGNOSIS — F411 Generalized anxiety disorder: Secondary | ICD-10-CM

## 2020-09-19 ENCOUNTER — Ambulatory Visit (INDEPENDENT_AMBULATORY_CARE_PROVIDER_SITE_OTHER): Payer: 59 | Admitting: Psychologist

## 2020-09-19 DIAGNOSIS — F320 Major depressive disorder, single episode, mild: Secondary | ICD-10-CM

## 2020-09-19 DIAGNOSIS — F411 Generalized anxiety disorder: Secondary | ICD-10-CM

## 2020-09-26 ENCOUNTER — Ambulatory Visit (INDEPENDENT_AMBULATORY_CARE_PROVIDER_SITE_OTHER): Payer: 59 | Admitting: Psychologist

## 2020-09-26 DIAGNOSIS — F411 Generalized anxiety disorder: Secondary | ICD-10-CM

## 2020-09-26 DIAGNOSIS — F320 Major depressive disorder, single episode, mild: Secondary | ICD-10-CM | POA: Diagnosis not present

## 2020-10-14 ENCOUNTER — Ambulatory Visit (INDEPENDENT_AMBULATORY_CARE_PROVIDER_SITE_OTHER): Payer: 59 | Admitting: Psychologist

## 2020-10-14 DIAGNOSIS — F320 Major depressive disorder, single episode, mild: Secondary | ICD-10-CM

## 2020-10-14 DIAGNOSIS — F411 Generalized anxiety disorder: Secondary | ICD-10-CM | POA: Diagnosis not present

## 2020-11-11 ENCOUNTER — Ambulatory Visit: Payer: 59 | Admitting: Psychologist

## 2020-12-12 ENCOUNTER — Ambulatory Visit: Payer: Managed Care, Other (non HMO) | Admitting: Family Medicine

## 2020-12-12 ENCOUNTER — Encounter: Payer: Self-pay | Admitting: Family Medicine

## 2020-12-12 ENCOUNTER — Other Ambulatory Visit: Payer: Self-pay

## 2020-12-12 DIAGNOSIS — F900 Attention-deficit hyperactivity disorder, predominantly inattentive type: Secondary | ICD-10-CM | POA: Diagnosis not present

## 2020-12-12 DIAGNOSIS — F411 Generalized anxiety disorder: Secondary | ICD-10-CM | POA: Diagnosis not present

## 2020-12-12 MED ORDER — ESCITALOPRAM OXALATE 5 MG PO TABS
5.0000 mg | ORAL_TABLET | Freq: Every day | ORAL | 3 refills | Status: DC
Start: 2020-12-12 — End: 2021-03-29

## 2020-12-12 NOTE — Assessment & Plan Note (Signed)
Continues to do well with Adderall at current strength.  We will plan to continue this.

## 2020-12-12 NOTE — Progress Notes (Signed)
Julie Brandt - 30 y.o. female MRN [id number]  Date of birth: [birth year]  Subjective Chief Complaint  Patient presents with   Medication Problem    HPI Julie Brandt is a 30 year old female here today for follow-up of anxiety.  This is currently treated with Lexapro.  She feels like this was helpful initially however she is feeling more melancholy at this point.  She would like to try either changing medication or decreasing current strength.  She is doing well with Adderall at current strength.  She recently got a new job and will be starting this shortly.  She expects to use this on a more regular basis when she starts this.  ROS:  A comprehensive ROS was completed and negative except as noted per HPI  Allergies  Allergen Reactions   Cephalosporins Hives and Rash    No cross reactions with penicillins    Past Medical History:  Diagnosis Date   BV (bacterial vaginosis)    Hx of abnormal cervical Papanicolaou smear     History reviewed. No pertinent surgical history.  Social History   Socioeconomic History   Marital status: Significant Other    Spouse name: Not on file   Number of children: Not on file   Years of education: Not on file   Highest education level: Not on file  Occupational History   Occupation: Doctor, general practice  Tobacco Use   Smoking status: Never   Smokeless tobacco: Never  Vaping Use   Vaping Use: Never used  Substance and Sexual Activity   Alcohol use: Not on file   Drug use: Never   Sexual activity: Yes    Partners: Male    Birth control/protection: I.U.D.  Other Topics Concern   Not on file  Social History Narrative   Not on file   Social Determinants of Health   Financial Resource Strain: Not on file  Food Insecurity: Not on file  Transportation Needs: Not on file  Physical Activity: Not on file  Stress: Not on file  Social Connections: Not on file    Family History  Problem Relation Age of Onset   Hypertension Mother    Skin cancer  Mother    Hypertension Maternal Grandfather    Diabetes Maternal Grandfather    Stroke Maternal Grandfather    Skin cancer Maternal Grandfather    Prostate cancer Maternal Grandfather    Lung cancer Paternal Grandmother    Parkinson's disease Father 4    Health Maintenance  Topic Date Due   HIV Screening  Never done   Hepatitis C Screening  Never done   COVID-19 Vaccine (3 - Booster for Moderna series) 05/30/2019   PAP SMEAR-Modifier  11/20/2022   TETANUS/TDAP  08/24/2027   INFLUENZA VACCINE  Completed   Pneumococcal Vaccine 27-54 Years old  Aged Out   HPV VACCINES  Aged Out     ----------------------------------------------------------------------------------------------------------------------------------------------------------------------------------------------------------------- Physical Exam BP 127/81 (BP Location: Left Arm, Patient Position: Sitting, Cuff Size: Large)   Pulse 88   Ht 5\' 2"  (1.575 m)   Wt 255 lb (115.7 kg)   SpO2 99%   BMI 46.64 kg/m   Physical Exam Constitutional:      Appearance: Normal appearance.  Eyes:     General: No scleral icterus. Neurological:     General: No focal deficit present.     Mental Status: She is alert.  Psychiatric:        Mood and Affect: Mood normal.        Behavior: Behavior normal.    -------------------------------------------------------------------------------------------------------------------------------------------------------------------------------------------------------------------  Assessment and Plan  GAD (generalized anxiety disorder) We will try reduction of Lexapro to 5 mg daily.  She continues to have side effects will try her switching to Trintellix.  Attention deficit hyperactivity disorder (ADHD), predominantly inattentive type Continues to do well with Adderall at current strength.  We will plan to continue this.   No orders of the defined types were placed in this encounter.   Return in  about 4 months (around 04/14/2021) for GAD.    This visit occurred during the SARS-CoV-2 public health emergency.  Safety protocols were in place, including screening questions prior to the visit, additional usage of staff PPE, and extensive cleaning of exam room while observing appropriate contact time as indicated for disinfecting solutions.

## 2020-12-12 NOTE — Patient Instructions (Addendum)
Let's try reducing lexapro to 5mg  daily.  If this is not helpful let's try Trintellix.   Try Zaditor/Cetirizine/Ketotifen eye drops or pataday  If this is not effective try Lumify

## 2020-12-12 NOTE — Assessment & Plan Note (Signed)
We will try reduction of Lexapro to 5 mg daily.  She continues to have side effects will try her switching to Trintellix.

## 2020-12-14 ENCOUNTER — Other Ambulatory Visit: Payer: Self-pay | Admitting: Family Medicine

## 2021-01-23 ENCOUNTER — Encounter: Payer: Self-pay | Admitting: Family Medicine

## 2021-01-23 MED ORDER — AMPHETAMINE-DEXTROAMPHET ER 10 MG PO CP24: 10.0000 mg | ORAL_CAPSULE | Freq: Every day | ORAL | 0 refills | Status: DC

## 2021-03-15 ENCOUNTER — Other Ambulatory Visit: Payer: Self-pay | Admitting: Family Medicine

## 2021-03-16 MED ORDER — AMPHETAMINE-DEXTROAMPHET ER 10 MG PO CP24: 10.0000 mg | ORAL_CAPSULE | Freq: Every day | ORAL | 0 refills | Status: DC

## 2021-03-29 ENCOUNTER — Encounter: Payer: Self-pay | Admitting: Family Medicine

## 2021-03-29 ENCOUNTER — Telehealth (INDEPENDENT_AMBULATORY_CARE_PROVIDER_SITE_OTHER): Payer: 59 | Admitting: Family Medicine

## 2021-03-29 DIAGNOSIS — F411 Generalized anxiety disorder: Secondary | ICD-10-CM

## 2021-03-29 DIAGNOSIS — F900 Attention-deficit hyperactivity disorder, predominantly inattentive type: Secondary | ICD-10-CM

## 2021-03-29 MED ORDER — VORTIOXETINE HBR 10 MG PO TABS: 10.0000 mg | ORAL_TABLET | Freq: Every day | ORAL | 3 refills | Status: DC

## 2021-03-29 NOTE — Progress Notes (Signed)
Julie Brandt - 31 y.o. female MRN [id number]  Date of birth: 25-Nov-1990   This visit type was conducted due to national recommendations for restrictions regarding the COVID-19 Pandemic (e.g. social distancing).  This format is felt to be most appropriate for this patient at this time.  All issues noted in this document were discussed and addressed.  No physical exam was performed (except for noted visual exam findings with Video Visits).  I discussed the limitations of evaluation and management by telemedicine and the availability of in person appointments. The patient expressed understanding and agreed to proceed.  I connected withNAME@ on 03/29/21 at  4:10 PM EST by a video enabled telemedicine application and verified that I am speaking with the correct person using two identifiers.  Present at visit: Everrett Coombe, DO Sarina Pinn   Patient Location: Home [address] [place] [address]   Provider location:   [facility]  Chief Complaint  Patient presents with   Medication Refill    HPI  Julie Brandt is a 31 y.o. female who presents via audio/video conferencing for a telehealth visit today.  She is following up today for ADHD and anxiety/depression.  She continues to do well with current dose of Adderall XR.  She has not noted any significant side effects from this including chest pain, headaches, palpitations or increased insomnia.  She would like to discuss changing SSRI.  Currently taking Lexapro.  Does not feel the 5 mg is effective however had side effects with 10 mg.  She is in the process of weaning from Lexapro.  She does feel like she needs to continue on medication for her symptoms.  She is seeing a therapist.  Depression screen [facility] 2/9 03/29/2021 12/13/2020 07/04/2020  Decreased Interest 1 3 1   Down, Depressed, Hopeless 2 3 1   PHQ - 2 Score 3 6 2   Altered sleeping 2 2 2   Tired, decreased energy 2 3 2   Change in appetite 1 1 0  Feeling bad or failure about  yourself  0 2 2  Trouble concentrating 2 1 1   Moving slowly or fidgety/restless 1 1 0  Suicidal thoughts 0 0 0  PHQ-9 Score 11 16 9   Difficult doing work/chores Somewhat difficult Very difficult Somewhat difficult   GAD 7 : Generalized Anxiety Score 03/29/2021 12/13/2020 07/04/2020 05/23/2020  Nervous, Anxious, on Edge 3 2 2 3   Control/stop worrying 2 1 1 2   Worry too much - different things 2 2 2 3   Trouble relaxing 2 2 2 2   Restless 1 1 1 1   Easily annoyed or irritable 2 2 3 3   Afraid - awful might happen 1 0 2 3  Total GAD 7 Score 13 10 13 17   Anxiety Difficulty Somewhat difficult Somewhat difficult Somewhat difficult Very difficult       ROS:  A comprehensive ROS was completed and negative except as noted per HPI  Past Medical History:  Diagnosis Date   BV (bacterial vaginosis)    Hx of abnormal cervical Papanicolaou smear     History reviewed. No pertinent surgical history.  Family History  Problem Relation Age of Onset   Hypertension Mother    Skin cancer Mother    Hypertension Maternal Grandfather    Diabetes Maternal Grandfather    Stroke Maternal Grandfather    Skin cancer Maternal Grandfather    Prostate cancer Maternal Grandfather    Lung cancer Paternal Grandmother    Parkinson's disease Father 1    Social History  Socioeconomic History   Marital status: Significant Other    Spouse name: Not on file   Number of children: Not on file   Years of education: Not on file   Highest education level: Not on file  Occupational History   Occupation: Doctor, general practice  Tobacco Use   Smoking status: Never   Smokeless tobacco: Never  Vaping Use   Vaping Use: Never used  Substance and Sexual Activity   Alcohol use: Not on file   Drug use: Never   Sexual activity: Yes    Partners: Male    Birth control/protection: I.U.D.  Other Topics Concern   Not on file  Social History Narrative   Not on file   Social Determinants of Health   Financial Resource  Strain: Not on file  Food Insecurity: Not on file  Transportation Needs: Not on file  Physical Activity: Not on file  Stress: Not on file  Social Connections: Not on file  Intimate Partner Violence: Not on file     Current Outpatient Medications:    amphetamine-dextroamphetamine (ADDERALL XR) 10 MG 24 hr capsule, Take 1 capsule (10 mg total) by mouth daily., Disp: 30 capsule, Rfl: 0   cetirizine (ZYRTEC) 10 MG tablet, Take 10 mg by mouth daily., Disp: , Rfl:    escitalopram (LEXAPRO) 5 MG tablet, Take 1 tablet (5 mg total) by mouth daily., Disp: 30 tablet, Rfl: 3   levonorgestrel (KYLEENA) 19.5 MG IUD, , Disp: , Rfl:   EXAM:  VITALS per patient if applicable: Pulse 91    Ht 5\' 2"  (1.575 m)    Wt 252 lb (114.3 kg)    SpO2 98%    BMI 46.09 kg/m   GENERAL: alert, oriented, appears well and in no acute distress  HEENT: atraumatic, conjunttiva clear, no obvious abnormalities on inspection of external nose and ears  NECK: normal movements of the head and neck  LUNGS: on inspection no signs of respiratory distress, breathing rate appears normal, no obvious gross SOB, gasping or wheezing  CV: no obvious cyanosis  MS: moves all visible extremities without noticeable abnormality  PSYCH/NEURO: pleasant and cooperative, no obvious depression or anxiety, speech and thought processing grossly intact  ASSESSMENT AND PLAN:  Discussed the following assessment and plan:  Attention deficit hyperactivity disorder (ADHD), predominantly inattentive type Continues to do well with current treatment Adderall XR.  We will plan to continue.  GAD (generalized anxiety disorder) Continue to wean from Lexapro.  We will start Trintellix to replace this.  Follow-up in 4 to 5 weeks.     I discussed the assessment and treatment plan with the patient. The patient was provided an opportunity to ask questions and all were answered. The patient agreed with the plan and demonstrated an understanding of the  instructions.   The patient was advised to call back or seek an in-person evaluation if the symptoms worsen or if the condition fails to improve as anticipated.    , DO

## 2021-03-29 NOTE — Assessment & Plan Note (Signed)
Continue to wean from Lexapro.  We will start Trintellix to replace this.  Follow-up in 4 to 5 weeks.

## 2021-03-29 NOTE — Assessment & Plan Note (Signed)
Continues to do well with current treatment Adderall XR.  We will plan to continue.

## 2021-03-31 ENCOUNTER — Encounter: Payer: Self-pay | Admitting: Family Medicine

## 2021-04-06 ENCOUNTER — Telehealth: Payer: Self-pay

## 2021-04-06 NOTE — Telephone Encounter (Signed)
Initiated Prior authorization IDP:[id number]) 10 MG TABS  Via: Covermymeds Case/Key:[id number] Status: approved as of 04/06/21 Reason:pt has a high co-pay,Drug is covered by current benefit plan. No further PA activity needed Notified Pt via: Mychart

## 2021-04-26 ENCOUNTER — Other Ambulatory Visit: Payer: Self-pay

## 2021-04-26 ENCOUNTER — Ambulatory Visit: Payer: 59 | Admitting: Family Medicine

## 2021-04-26 ENCOUNTER — Encounter: Payer: Self-pay | Admitting: Family Medicine

## 2021-04-26 VITALS — BP 128/80 | HR 94 | Ht 62.0 in | Wt 255.0 lb

## 2021-04-26 DIAGNOSIS — F900 Attention-deficit hyperactivity disorder, predominantly inattentive type: Secondary | ICD-10-CM

## 2021-04-26 DIAGNOSIS — Z13220 Encounter for screening for lipoid disorders: Secondary | ICD-10-CM | POA: Diagnosis not present

## 2021-04-26 DIAGNOSIS — R635 Abnormal weight gain: Secondary | ICD-10-CM | POA: Diagnosis not present

## 2021-04-26 DIAGNOSIS — F411 Generalized anxiety disorder: Secondary | ICD-10-CM

## 2021-04-26 MED ORDER — SEMAGLUTIDE-WEIGHT MANAGEMENT 2.4 MG/0.75ML ~~LOC~~ SOAJ: 2.4000 mg | SUBCUTANEOUS | 1 refills | Status: DC

## 2021-04-26 MED ORDER — SEMAGLUTIDE-WEIGHT MANAGEMENT 1 MG/0.5ML ~~LOC~~ SOAJ: 1.0000 mg | SUBCUTANEOUS | 0 refills | Status: AC

## 2021-04-26 MED ORDER — SEMAGLUTIDE-WEIGHT MANAGEMENT 0.25 MG/0.5ML ~~LOC~~ SOAJ
0.2500 mg | SUBCUTANEOUS | 0 refills | Status: AC
Start: 2021-04-26 — End: 2021-05-24

## 2021-04-26 MED ORDER — SEMAGLUTIDE-WEIGHT MANAGEMENT 1.7 MG/0.75ML ~~LOC~~ SOAJ
1.7000 mg | SUBCUTANEOUS | 0 refills | Status: AC
Start: 2021-07-22 — End: 2021-08-19

## 2021-04-26 MED ORDER — VORTIOXETINE HBR 10 MG PO TABS: 10.0000 mg | ORAL_TABLET | Freq: Every day | ORAL | 1 refills | Status: DC

## 2021-04-26 MED ORDER — AMPHETAMINE-DEXTROAMPHET ER 10 MG PO CP24: 10.0000 mg | ORAL_CAPSULE | Freq: Every day | ORAL | 0 refills | Status: DC

## 2021-04-26 MED ORDER — SEMAGLUTIDE-WEIGHT MANAGEMENT 0.5 MG/0.5ML ~~LOC~~ SOAJ
0.5000 mg | SUBCUTANEOUS | 0 refills | Status: AC
Start: 2021-05-25 — End: 2021-06-22

## 2021-04-26 NOTE — Patient Instructions (Signed)
Great to see you today! ?Let me know if coupon for trintellix isn't helpful.  ?I have sent Wegovy in as well.  ?Follow up after taking Wegovy for about 3 months.   ?

## 2021-04-27 ENCOUNTER — Ambulatory Visit: Payer: 59 | Admitting: Family Medicine

## 2021-04-29 LAB — HEMOGLOBIN A1C
Est. average glucose Bld gHb Est-mCnc: 108 mg/dL
Hgb A1c MFr Bld: 5.4 % (ref 4.8–5.6)

## 2021-04-29 LAB — CMP14+EGFR
ALT: 22 IU/L (ref 0–32)
AST: 21 IU/L (ref 0–40)
Albumin/Globulin Ratio: 1.8 (ref 1.2–2.2)
Albumin: 4.4 g/dL (ref 3.8–4.8)
Alkaline Phosphatase: 102 IU/L (ref 44–121)
BUN/Creatinine Ratio: 14 (ref 9–23)
BUN: 11 mg/dL (ref 6–20)
Bilirubin Total: 0.7 mg/dL (ref 0.0–1.2)
CO2: 22 mmol/L (ref 20–29)
Calcium: 9.5 mg/dL (ref 8.7–10.2)
Chloride: 105 mmol/L (ref 96–106)
Creatinine, Ser: 0.8 mg/dL (ref 0.57–1.00)
Globulin, Total: 2.5 g/dL (ref 1.5–4.5)
Glucose: 94 mg/dL (ref 70–99)
Potassium: 4.5 mmol/L (ref 3.5–5.2)
Sodium: 138 mmol/L (ref 134–144)
Total Protein: 6.9 g/dL (ref 6.0–8.5)
eGFR: 100 mL/min/{1.73_m2} (ref >59)

## 2021-04-29 LAB — CBC WITH DIFFERENTIAL/PLATELET
Basophils Absolute: 0 10*3/uL (ref 0.0–0.2)
Basos: 1 %
EOS (ABSOLUTE): 0.1 10*3/uL (ref 0.0–0.4)
Eos: 2 %
Hematocrit: 38.8 % (ref 34.0–46.6)
Hemoglobin: 13.4 g/dL (ref 11.1–15.9)
Immature Grans (Abs): 0 10*3/uL (ref 0.0–0.1)
Immature Granulocytes: 0 %
Lymphocytes Absolute: 1.5 10*3/uL (ref 0.7–3.1)
Lymphs: 35 %
MCH: 29.5 pg (ref 26.6–33.0)
MCHC: 34.5 g/dL (ref 31.5–35.7)
MCV: 85 fL (ref 79–97)
Monocytes Absolute: 0.3 10*3/uL (ref 0.1–0.9)
Monocytes: 7 %
Neutrophils Absolute: 2.4 10*3/uL (ref 1.4–7.0)
Neutrophils: 55 %
Platelets: 231 10*3/uL (ref 150–450)
RBC: 4.55 x10E6/uL (ref 3.77–5.28)
RDW: 12.2 % (ref 11.7–15.4)
WBC: 4.4 10*3/uL (ref 3.4–10.8)

## 2021-04-29 LAB — LIPID PANEL
Chol/HDL Ratio: 4.3 ratio (ref 0.0–4.4)
Cholesterol, Total: 194 mg/dL (ref 100–199)
HDL: 45 mg/dL (ref >39)
LDL Chol Calc (NIH): 125 mg/dL — ABNORMAL HIGH (ref 0–99)
Triglycerides: 134 mg/dL (ref 0–149)
VLDL Cholesterol Cal: 24 mg/dL (ref 5–40)

## 2021-04-29 LAB — TSH: TSH: 1.48 u[IU]/mL (ref 0.450–4.500)

## 2021-04-30 NOTE — Assessment & Plan Note (Signed)
She continues to do quite well with current dose of Adderall XR.  Prescription renewed today. ?

## 2021-04-30 NOTE — Assessment & Plan Note (Signed)
We discussed addition of Wegovy.  Starting at 0.25 weekly x4 weeks and will titrate as tolerated.  Follow-up in approximately 3 months. ?

## 2021-04-30 NOTE — Progress Notes (Signed)
?Julie Brandt - 31 y.o. female MRN [id number]  Date of birth: [birth year] ? ?Subjective ?Chief Complaint  ?Patient presents with  ? Follow-up  ?  Mood  ? Weight Loss  ? ? ?HPI ?Julie Brandt is a 31 year old female here today for follow-up visit.  Reports she is doing okay at this time.  We will prescribe Trintellix for her last time which was approved by insurance however price was still high.  She is planning to look into using manufactures coupon to help with this.  Continues to do well with current dose of Adderall.  No significant side effects with current dose at this time. ? ?She is interested in trying medication to help with weight management.  She would like to try a GLP-1 type medication to help with this.  She is familiar with side effects and potential interactions with this medication. ? ?She would also like to go ahead and have updated labs ordered. ? ?ROS:  A comprehensive ROS was completed and negative except as noted per HPI ? ?Allergies  ?Allergen Reactions  ? Cephalosporins Hives and Rash  ?  No cross reactions with penicillins  ? ? ?Past Medical History:  ?Diagnosis Date  ? BV (bacterial vaginosis)   ? Hx of abnormal cervical Papanicolaou smear   ? ? ?History reviewed. No pertinent surgical history. ? ?Social History  ? ?Socioeconomic History  ? Marital status: Significant Other  ?  Spouse name: Not on file  ? Number of children: Not on file  ? Years of education: Not on file  ? Highest education level: Not on file  ?Occupational History  ? Occupation: Surveyor, minerals  ?Tobacco Use  ? Smoking status: Never  ? Smokeless tobacco: Never  ?Vaping Use  ? Vaping Use: Never used  ?Substance and Sexual Activity  ? Alcohol use: Not on file  ? Drug use: Never  ? Sexual activity: Yes  ?  Partners: Male  ?  Birth control/protection: I.U.D.  ?Other Topics Concern  ? Not on file  ?Social History Narrative  ? Not on file  ? ?Social Determinants of Health  ? ?Financial Resource Strain: Not on file  ?Food Insecurity:  Not on file  ?Transportation Needs: Not on file  ?Physical Activity: Not on file  ?Stress: Not on file  ?Social Connections: Not on file  ? ? ?Family History  ?Problem Relation Age of Onset  ? Hypertension Mother   ? Skin cancer Mother   ? Hypertension Maternal Grandfather   ? Diabetes Maternal Grandfather   ? Stroke Maternal Grandfather   ? Skin cancer Maternal Grandfather   ? Prostate cancer Maternal Grandfather   ? Lung cancer Paternal Grandmother   ? Parkinson's disease Father 67  ? ? ?Health Maintenance  ?Topic Date Due  ? HIV Screening  Never done  ? Hepatitis C Screening  Never done  ? COVID-19 Vaccine (3 - Booster for Moderna series) 05/30/2019  ? PAP SMEAR-Modifier  11/20/2022  ? TETANUS/TDAP  08/24/2027  ? INFLUENZA VACCINE  Completed  ? HPV VACCINES  Aged Out  ? ? ? ?----------------------------------------------------------------------------------------------------------------------------------------------------------------------------------------------------------------- ?Physical Exam ?BP 128/80 (BP Location: Left Arm, Patient Position: Sitting, Cuff Size: Large)   Pulse 94   Ht 5\' 2"  (1.575 m)   Wt 255 lb (115.7 kg)   SpO2 98%   BMI 46.64 kg/m?  ? ?Physical Exam ?Constitutional:   ?   Appearance: Normal appearance.  ?HENT:  ?   Head: Normocephalic and atraumatic.  ?Eyes:  ?   General:  No scleral icterus. ?Cardiovascular:  ?   Rate and Rhythm: Normal rate and regular rhythm.  ?Pulmonary:  ?   Effort: Pulmonary effort is normal.  ?   Breath sounds: Normal breath sounds.  ?Musculoskeletal:  ?   Cervical back: Neck supple.  ?Neurological:  ?   General: No focal deficit present.  ?   Mental Status: She is alert.  ?Psychiatric:     ?   Mood and Affect: Mood normal.     ?   Behavior: Behavior normal.   ? ? ?------------------------------------------------------------------------------------------------------------------------------------------------------------------------------------------------------------------- ?Assessment and Plan ? ?Attention deficit hyperactivity disorder (ADHD), predominantly inattentive type ?She continues to do quite well with current dose of Adderall XR.  Prescription renewed today. ? ?GAD (generalized anxiety disorder) ?She has had an opportunity to try Trintellix yet but plans to try to pick this up using manufacture coupon.  She will let me know if this continues to be too expensive. ? ?Obesity, morbid, BMI 40.0-49.9 ([place]) ?We discussed addition of Wegovy.  Starting at 0.25 weekly x4 weeks and will titrate as tolerated.  Follow-up in approximately 3 months. ? ? ?Meds ordered this encounter  ?Medications  ? vortioxetine HBr (TRINTELLIX) 10 MG TABS tablet  ?  Sig: Take 1 tablet (10 mg total) by mouth daily.  ?  Dispense:  90 tablet  ?  Refill:  1  ? amphetamine-dextroamphetamine (ADDERALL XR) 10 MG 24 hr capsule  ?  Sig: Take 1 capsule (10 mg total) by mouth daily.  ?  Dispense:  90 capsule  ?  Refill:  0  ? Semaglutide-Weight Management 0.25 MG/0.5ML SOAJ  ?  Sig: Inject 0.25 mg into the skin once a week for 28 days.  ?  Dispense:  2 mL  ?  Refill:  0  ? Semaglutide-Weight Management 2.4 MG/0.75ML SOAJ  ?  Sig: Inject 2.4 mg into the skin once a week for 28 days.  ?  Dispense:  3 mL  ?  Refill:  1  ? Semaglutide-Weight Management 0.5 MG/0.5ML SOAJ  ?  Sig: Inject 0.5 mg into the skin once a week for 28 days.  ?  Dispense:  2 mL  ?  Refill:  0  ? Semaglutide-Weight Management 1 MG/0.5ML SOAJ  ?  Sig: Inject 1 mg into the skin once a week for 28 days.  ?  Dispense:  2 mL  ?  Refill:  0  ? Semaglutide-Weight Management 1.7 MG/0.75ML SOAJ  ?  Sig: Inject 1.7 mg into the skin once a week for 28 days.  ?  Dispense:  3 mL  ?  Refill:  0  ? ? ?No follow-ups on file. ? ? ? ?This visit  occurred during the SARS-CoV-2 public health emergency.  Safety protocols were in place, including screening questions prior to the visit, additional usage of staff PPE, and extensive cleaning of exam room while observing appropriate contact time as indicated for disinfecting solutions.  ? ?

## 2021-04-30 NOTE — Assessment & Plan Note (Signed)
She has had an opportunity to try Trintellix yet but plans to try to pick this up using manufacture coupon.  She will let me know if this continues to be too expensive. ?

## 2021-05-02 ENCOUNTER — Encounter: Payer: Self-pay | Admitting: Family Medicine

## 2021-05-02 MED ORDER — AMPHETAMINE-DEXTROAMPHET ER 10 MG PO CP24: 10.0000 mg | ORAL_CAPSULE | Freq: Every day | ORAL | 0 refills | Status: DC

## 2021-05-03 ENCOUNTER — Telehealth: Payer: Self-pay

## 2021-05-03 NOTE — Telephone Encounter (Signed)
Initiated Prior authorization for: ?Wegovy 0.25MG /0.5ML auto-injectors ?Wegovy 0.5 MG/0.5ML SOAJauto-injectors ?Wegovy 1 MG/0.5ML SOAJ auto-injectors ?Wegovy 1.7 MG/0.75ML SOAJauto-injectors ?Wegovy  2.4 MG/0.75ML SOAJ ?Via: Covermymeds ?Case/Key:[id number] ?Status: approved  as of 05/03/2021 ?Reason:Coverage Start Date:04/03/2021;Coverage End Date:11/29/2021; ?Notified Pt via: Mychart ?

## 2021-06-12 ENCOUNTER — Other Ambulatory Visit: Payer: Self-pay | Admitting: Family Medicine

## 2021-06-12 MED ORDER — AMPHETAMINE-DEXTROAMPHET ER 10 MG PO CP24: 10.0000 mg | ORAL_CAPSULE | Freq: Every day | ORAL | 0 refills | Status: DC

## 2021-06-22 ENCOUNTER — Other Ambulatory Visit: Payer: Self-pay | Admitting: Family Medicine

## 2021-07-30 ENCOUNTER — Other Ambulatory Visit: Payer: Self-pay | Admitting: Family Medicine

## 2021-07-31 ENCOUNTER — Other Ambulatory Visit: Payer: Self-pay | Admitting: Family Medicine

## 2021-07-31 ENCOUNTER — Encounter: Payer: Self-pay | Admitting: Family Medicine

## 2021-07-31 MED ORDER — AMPHETAMINE-DEXTROAMPHET ER 10 MG PO CP24: 10.0000 mg | ORAL_CAPSULE | Freq: Every day | ORAL | 0 refills | Status: DC

## 2021-08-28 ENCOUNTER — Telehealth (INDEPENDENT_AMBULATORY_CARE_PROVIDER_SITE_OTHER): Payer: Self-pay | Admitting: Family Medicine

## 2021-08-28 ENCOUNTER — Encounter: Payer: Self-pay | Admitting: Family Medicine

## 2021-08-28 ENCOUNTER — Other Ambulatory Visit (HOSPITAL_COMMUNITY): Payer: Self-pay

## 2021-08-28 DIAGNOSIS — F900 Attention-deficit hyperactivity disorder, predominantly inattentive type: Secondary | ICD-10-CM

## 2021-08-28 MED ORDER — AMPHETAMINE-DEXTROAMPHET ER 10 MG PO CP24
10.0000 mg | ORAL_CAPSULE | Freq: Every day | ORAL | 0 refills | Status: DC
  Filled 2021-08-28: qty 90, 90d supply, fill #0

## 2021-08-28 MED ORDER — SEMAGLUTIDE-WEIGHT MANAGEMENT 1 MG/0.5ML ~~LOC~~ SOAJ
1.0000 mg | SUBCUTANEOUS | 0 refills | Status: DC
  Filled 2021-08-28: qty 2, 28d supply, fill #0

## 2021-08-28 MED ORDER — SEMAGLUTIDE-WEIGHT MANAGEMENT 1.7 MG/0.75ML ~~LOC~~ SOAJ
1.7000 mg | SUBCUTANEOUS | 0 refills | Status: DC
  Filled 2021-08-28: qty 3, 28d supply, fill #0

## 2021-08-28 NOTE — Assessment & Plan Note (Signed)
So far she is doing well with Wegovy.  Will plan to continue titration to 1mg  and then further to 1.7mg  if tolerating well over the next month.  I will see her again in about 2 months.

## 2021-08-28 NOTE — Progress Notes (Signed)
Julie Brandt - 31 y.o. female MRN [id number]  Date of birth: Jun 04, 1990   This visit type was conducted due to national recommendations for restrictions regarding the COVID-19 Pandemic (e.g. social distancing).  This format is felt to be most appropriate for this patient at this time.  All issues noted in this document were discussed and addressed.  No physical exam was performed (except for noted visual exam findings with Video Visits).  I discussed the limitations of evaluation and management by telemedicine and the availability of in person appointments. The patient expressed understanding and agreed to proceed.  I connected withNAME@ on 08/28/21 at  1:10 PM EDT by a video enabled telemedicine application and verified that I am speaking with the correct person using two identifiers.  Present at visit: Julie Coombe, DO Julie Brandt   Patient Location: Home [address] [place] [address]   Provider location:   [facility]  Chief Complaint  Patient presents with   Weight Loss Surgery    HPI  Julie Brandt is a 31 y.o. female who presents via audio/video conferencing for a telehealth visit today.  Reports that she is doing well with Wegovy.  Weight is down about 10 lbs so far.  She is currently on 0.5mg  dose.  She is having mild nausea for 1-2 days after injection.  Feels that she is ready to progress to 1mg  dose.    Additionally she is doing well with adderall xr 10mg  daily.  No issues with tolerance or side effects.  Good efficacy with current strength.      ROS:  A comprehensive ROS was completed and negative except as noted per HPI  Past Medical History:  Diagnosis Date   BV (bacterial vaginosis)    Hx of abnormal cervical Papanicolaou smear     No past surgical history on file.  Family History  Problem Relation Age of Onset   Hypertension Mother    Skin cancer Mother    Hypertension Maternal Grandfather    Diabetes Maternal Grandfather    Stroke Maternal  Grandfather    Skin cancer Maternal Grandfather    Prostate cancer Maternal Grandfather    Lung cancer Paternal Grandmother    Parkinson's disease Father 38    Social History   Socioeconomic History   Marital status: Significant Other    Spouse name: Not on file   Number of children: Not on file   Years of education: Not on file   Highest education level: Not on file  Occupational History   Occupation:  Tobacco Use   Smoking status: Never   Smokeless tobacco: Never  Vaping Use   Vaping Use: Never used  Substance and Sexual Activity   Alcohol use: Not on file   Drug use: Never   Sexual activity: Yes    Partners: Male    Birth control/protection: I.U.D.  Other Topics Concern   Not on file  Social History Narrative   Not on file   Social Determinants of Health   Financial Resource Strain: Not on file  Food Insecurity: Not on file  Transportation Needs: Not on file  Physical Activity: Not on file  Stress: Not on file  Social Connections: Not on file  Intimate Partner Violence: Not on file     Current Outpatient Medications:    cetirizine (ZYRTEC) 10 MG tablet, Take 10 mg by mouth daily., Disp: , Rfl:    ketotifen (ALAWAY) 0.025 % ophthalmic solution, 1 drop 2 (two) times daily.,  Disp: , Rfl:    levonorgestrel (KYLEENA) 19.5 MG IUD, , Disp: , Rfl:    Semaglutide-Weight Management 0.5 MG/0.5ML SOAJ, Inject 0.5 mg into the skin., Disp: , Rfl:    [START ON 10/25/2021] Semaglutide-Weight Management 1 MG/0.5ML SOAJ, Inject 1 mg into the skin once a week for 28 days. Increase to 1.7mg  after 28 days., Disp: 2 mL, Rfl: 0   [START ON 11/23/2021] Semaglutide-Weight Management 1.7 MG/0.75ML SOAJ, Inject 1.7 mg into the skin once a week for 28 days., Disp: 3 mL, Rfl: 0   amphetamine-dextroamphetamine (ADDERALL XR) 10 MG 24 hr capsule, Take 1 capsule (10 mg total) by mouth daily., Disp: 90 capsule, Rfl: 0  EXAM:  VITALS per patient if applicable: Ht 5\' 2"   (1.575 m)   Wt 243 lb (110.2 kg)   BMI 44.45 kg/m   GENERAL: alert, oriented, appears well and in no acute distress  HEENT: atraumatic, conjunttiva clear, no obvious abnormalities on inspection of external nose and ears  NECK: normal movements of the head and neck  LUNGS: on inspection no signs of respiratory distress, breathing rate appears normal, no obvious gross SOB, gasping or wheezing  CV: no obvious cyanosis  MS: moves all visible extremities without noticeable abnormality  PSYCH/NEURO: pleasant and cooperative, no obvious depression or anxiety, speech and thought processing grossly intact  ASSESSMENT AND PLAN:  Discussed the following assessment and plan:  Obesity, morbid, BMI 40.0-49.9 ([facility]) So far she is doing well with 06[birth year]02-1993.  Will plan to continue titration to 1mg  and then further to 1.7mg  if tolerating well over the next month.  I will see her again in about 2 months.   Attention deficit hyperactivity disorder (ADHD), predominantly inattentive type She continues to do well with current strength of adderall xr 10mg  daily.  Rx renewed today.       I discussed the assessment and treatment plan with the patient. The patient was provided an opportunity to ask questions and all were answered. The patient agreed with the plan and demonstrated an understanding of the instructions.   The patient was advised to call back or seek an in-person evaluation if the symptoms worsen or if the condition fails to improve as anticipated.    DJMEQA, DO

## 2021-08-28 NOTE — Assessment & Plan Note (Signed)
She continues to do well with current strength of adderall xr 10mg  daily.  Rx renewed today.

## 2021-08-28 NOTE — Progress Notes (Signed)
Nausea with the [facility].

## 2021-08-29 ENCOUNTER — Other Ambulatory Visit (HOSPITAL_COMMUNITY): Payer: Self-pay

## 2021-09-01 ENCOUNTER — Encounter: Payer: Self-pay | Admitting: Family Medicine

## 2021-09-04 ENCOUNTER — Other Ambulatory Visit (HOSPITAL_COMMUNITY): Payer: Self-pay

## 2021-09-05 ENCOUNTER — Encounter: Payer: Self-pay | Admitting: Family Medicine

## 2021-09-11 ENCOUNTER — Other Ambulatory Visit (HOSPITAL_COMMUNITY): Payer: Self-pay

## 2021-09-13 ENCOUNTER — Other Ambulatory Visit (HOSPITAL_COMMUNITY): Payer: Self-pay

## 2021-09-13 ENCOUNTER — Other Ambulatory Visit: Payer: Self-pay | Admitting: Family Medicine

## 2021-09-13 ENCOUNTER — Telehealth: Payer: Self-pay

## 2021-09-13 MED ORDER — SEMAGLUTIDE-WEIGHT MANAGEMENT 1 MG/0.5ML ~~LOC~~ SOAJ
1.0000 mg | SUBCUTANEOUS | 0 refills | Status: AC
  Filled 2021-09-13: qty 2, 28d supply, fill #0

## 2021-09-13 MED ORDER — SEMAGLUTIDE-WEIGHT MANAGEMENT 1.7 MG/0.75ML ~~LOC~~ SOAJ
1.7000 mg | SUBCUTANEOUS | 0 refills | Status: DC
  Filled 2021-09-13 – 2021-10-24 (×2): qty 3, 28d supply, fill #0

## 2021-09-13 NOTE — Telephone Encounter (Signed)
Initiated Prior authorization [id number]  Via: Covermymeds Case/Key:[id number] Status: approved as of 09/13/2021 Reason:The request has been approved. The authorization is effective for a maximum of 7 fills from 09/13/2021 to 04/04/2022, as long as the member is enrolled in their current health plan. The request was approved as submitted. The request was approved for 24mL per 28 days.Additional prior authorizations ([place]) have been entered TSV:[id number] 0.25mg /0.41mL allowing a maximum of 7 fills with a quantity limit [place] 34mL [place] 28 days ([place] [id number])Wegovy 0.5mg /0.49mL allowing a maximum of 7 fills with a quantity limit of 43mL per 28 days ([place] 19068)Wegovy 1.7mg /0.40mL allowing a maximum of 7 fills with a quantity limit of 19mL per 28 days ([place] 19069)Wegovy 2.4mg /0.13mL allowing a maximum of 7 fills with a quantity limit of 59mL per 28 days ([place] 19070)These authorizations are effective 09/13/2021 through 04/04/2022. A written notification letter will follow with additional details. Notified Pt via: Mychart

## 2021-09-22 ENCOUNTER — Other Ambulatory Visit (HOSPITAL_COMMUNITY): Payer: Self-pay

## 2021-10-24 ENCOUNTER — Other Ambulatory Visit (HOSPITAL_COMMUNITY): Payer: Self-pay

## 2021-10-25 ENCOUNTER — Other Ambulatory Visit (HOSPITAL_COMMUNITY): Payer: Self-pay

## 2021-11-13 IMAGING — US US SOFT TISSUE HEAD/NECK
1 series · 14 of 24 positions shown · non-contrast
Comparison: None.

CLINICAL DATA: Palpable lymph node for 1 week

EXAM:
ULTRASOUND OF HEAD/NECK SOFT TISSUES
TECHNIQUE: Ultrasound examination of the head and neck soft tissues was
performed in the area of clinical concern.

[Series 1: us soft tissue head/neck · 0.05mm/px · 24 acquisitions, 14 frames shown]
[im 1/24]
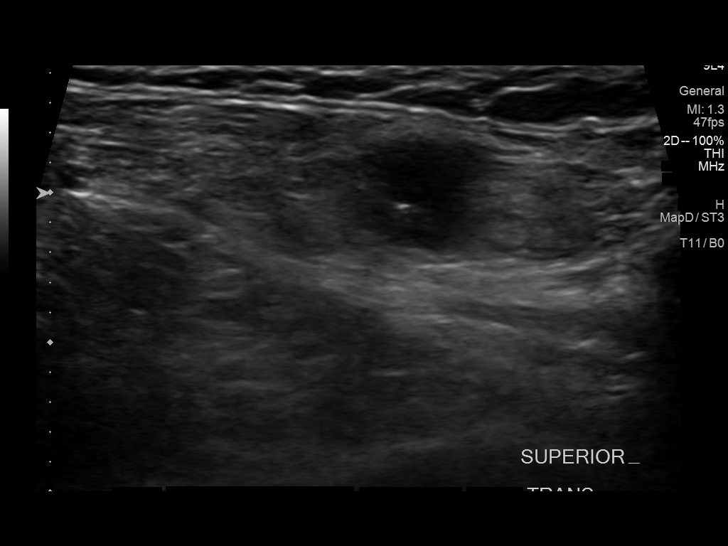
[im 3/24]
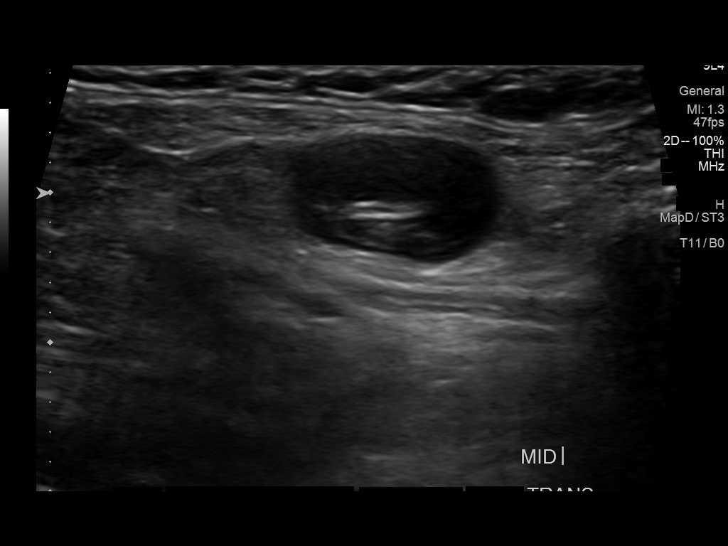
[im 5/24]
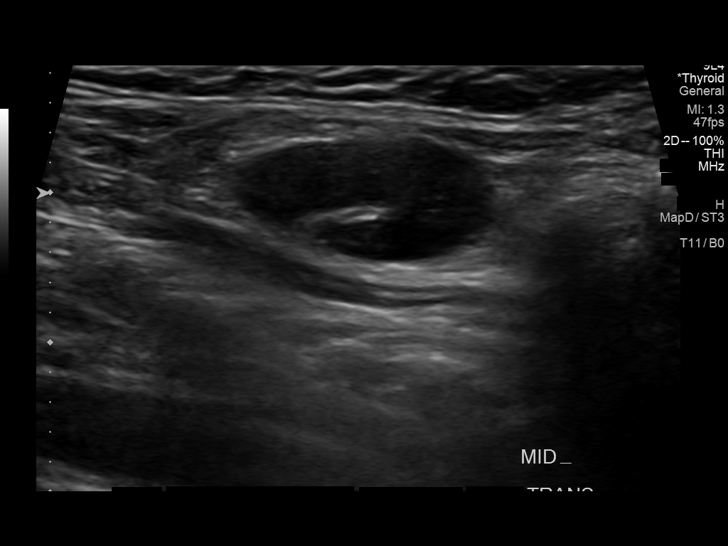
[im 7/24]
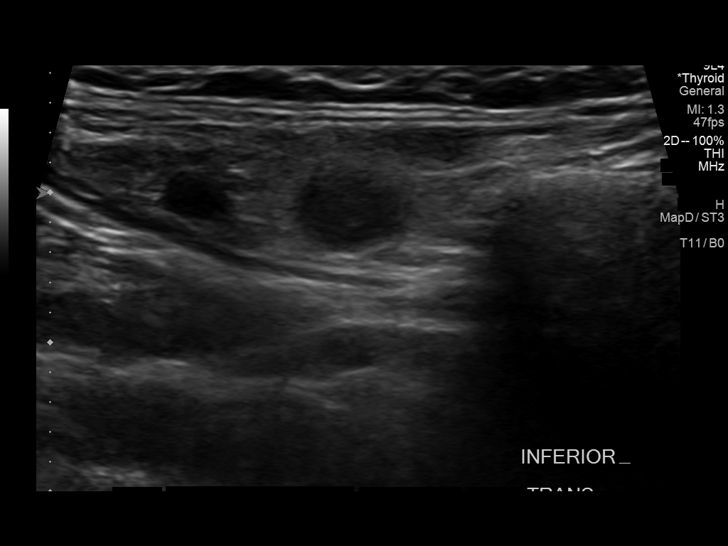
[im 8/24]
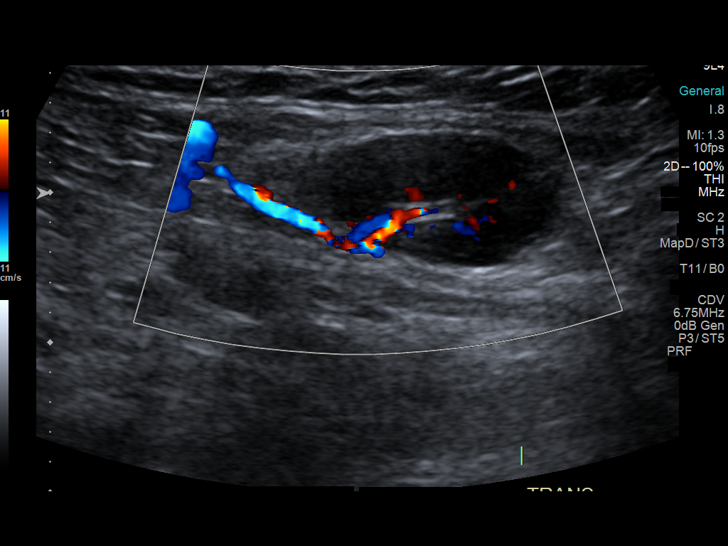
[im 10/24]
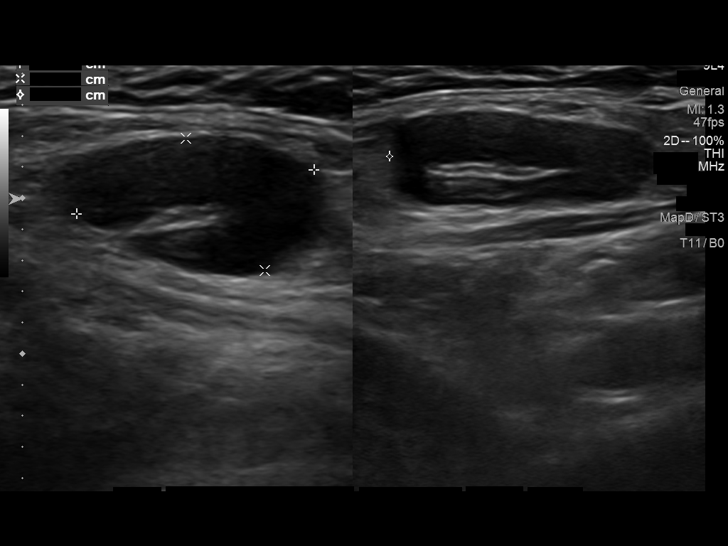
[im 12/24]
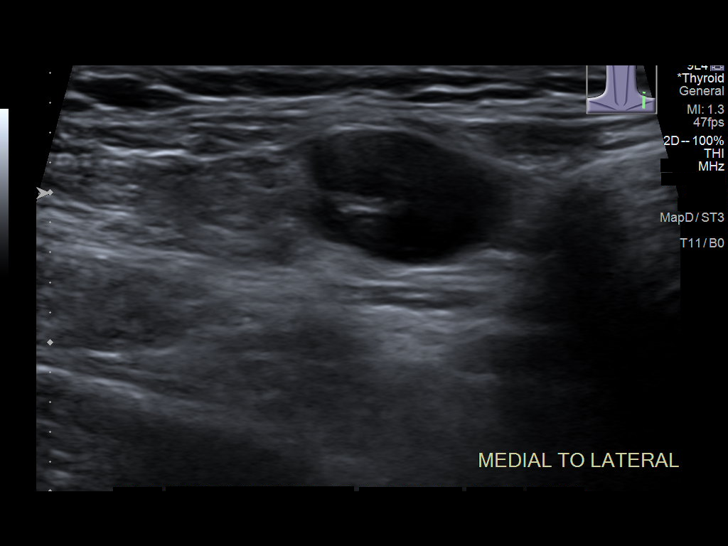
[im 13/24]
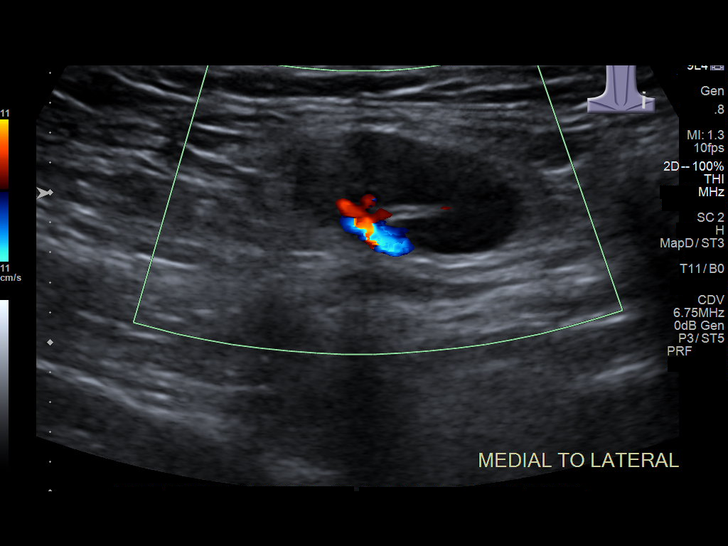
[im 15/24]
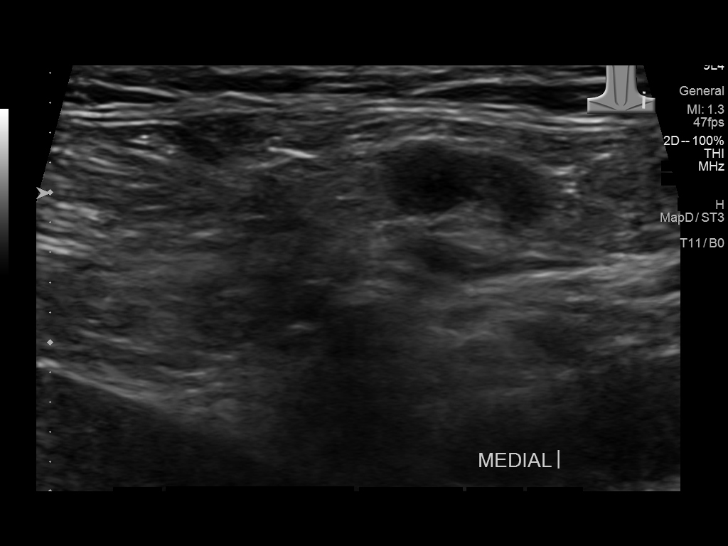
[im 17/24]
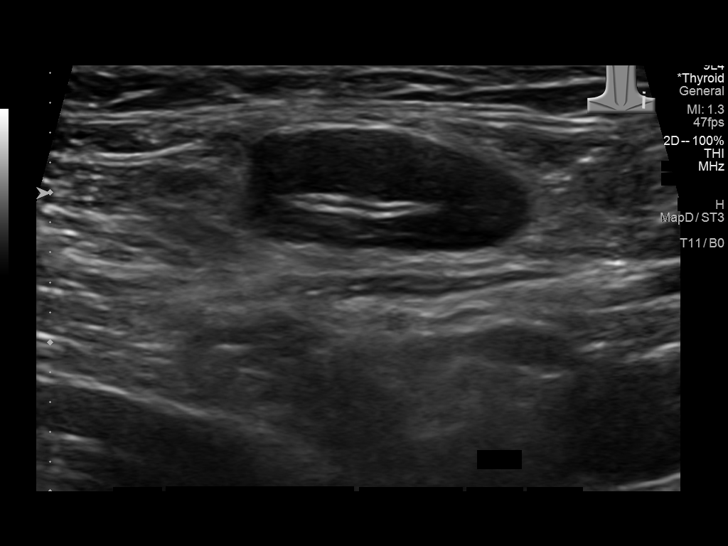
[im 19/24]
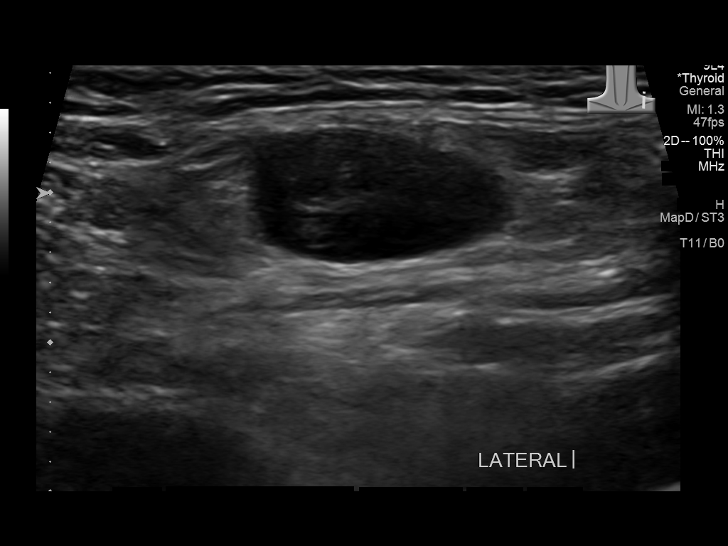
[im 20/24]
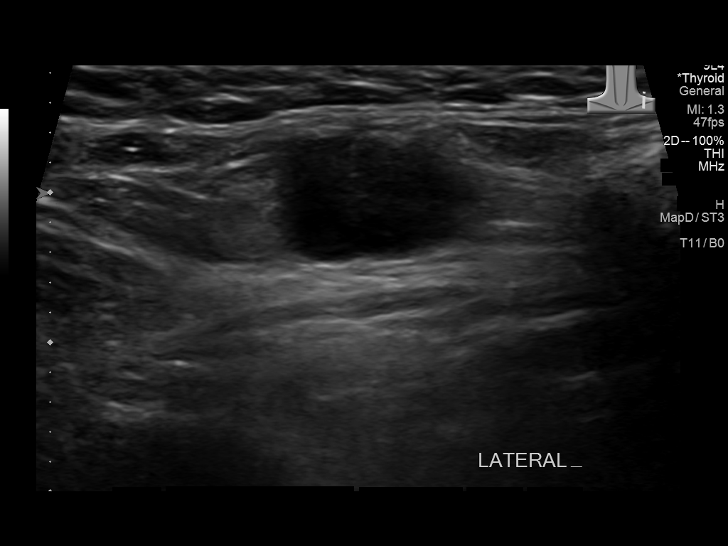
[im 22/24]
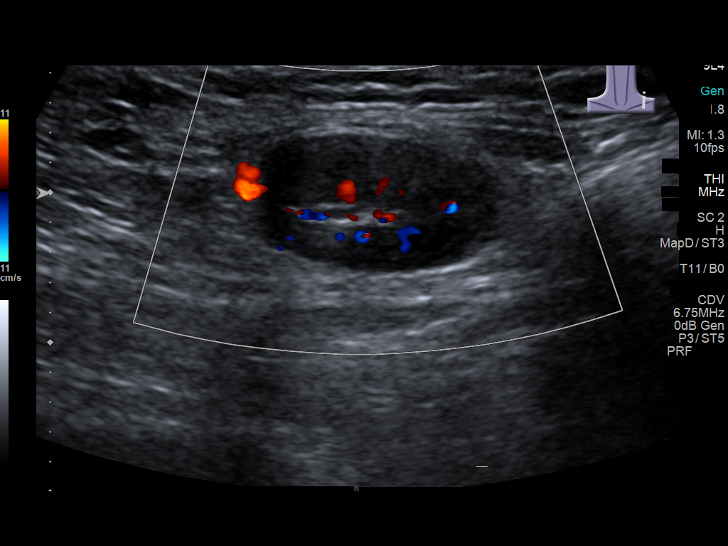
[im 24/24]
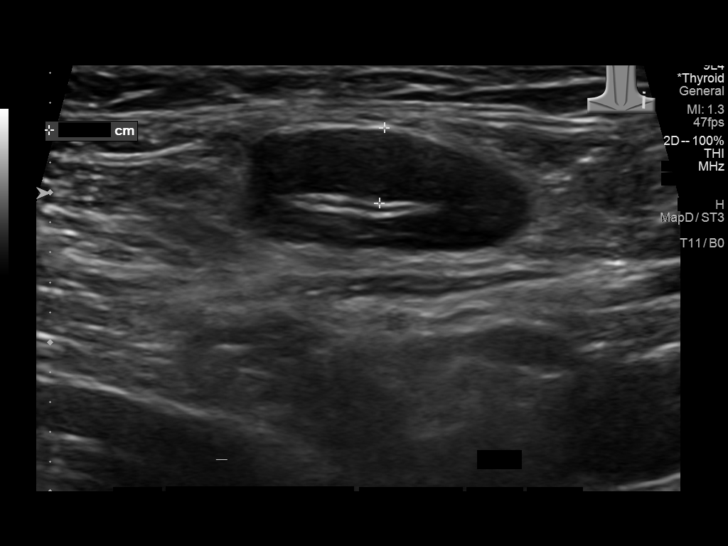

[14 of 24 positions shown; findings below may reference images not displayed]

FINDINGS: Palpable complaint reflects a hypoechoic nodule with hilar
architecture. The cortex is smoothly contoured and homogeneous. No
adjacent collection is seen. Dimensions are up to 2.3 x 1 cm,
enlarged for this location.
IMPRESSION: Palpable complaint reflects a single enlarged lymph node which is
nonspecific in isolation. If persistent or progressive a neck or
chest CT could be obtained to evaluate for underlying cause or
generalized adenopathy.

## 2021-12-11 ENCOUNTER — Other Ambulatory Visit: Payer: Self-pay | Admitting: Family Medicine

## 2021-12-12 MED ORDER — WEGOVY 1.7 MG/0.75ML ~~LOC~~ SOAJ
1.7000 mg | SUBCUTANEOUS | 0 refills | Status: AC
  Filled 2021-12-12: qty 3, 28d supply, fill #0

## 2021-12-13 ENCOUNTER — Other Ambulatory Visit (HOSPITAL_COMMUNITY): Payer: Self-pay

## 2021-12-21 ENCOUNTER — Other Ambulatory Visit (HOSPITAL_COMMUNITY): Payer: Self-pay

## 2021-12-21 MED ORDER — ITRACONAZOLE 100 MG PO CAPS
200.0000 mg | ORAL_CAPSULE | Freq: Every day | ORAL | 2 refills | Status: DC
  Filled 2021-12-21: qty 60, 30d supply, fill #0
  Filled 2022-01-21: qty 60, 30d supply, fill #1
  Filled 2022-04-18: qty 60, 30d supply, fill #2

## 2021-12-22 ENCOUNTER — Other Ambulatory Visit (HOSPITAL_COMMUNITY): Payer: Self-pay

## 2021-12-25 ENCOUNTER — Other Ambulatory Visit (HOSPITAL_COMMUNITY): Payer: Self-pay

## 2022-01-21 ENCOUNTER — Encounter: Payer: Self-pay | Admitting: Family Medicine

## 2022-01-21 ENCOUNTER — Other Ambulatory Visit: Payer: Self-pay | Admitting: Family Medicine

## 2022-01-21 ENCOUNTER — Other Ambulatory Visit (HOSPITAL_COMMUNITY): Payer: Self-pay

## 2022-01-22 ENCOUNTER — Other Ambulatory Visit (HOSPITAL_COMMUNITY): Payer: Self-pay

## 2022-01-22 MED ORDER — SEMAGLUTIDE-WEIGHT MANAGEMENT 1.7 MG/0.75ML ~~LOC~~ SOAJ: 1.7000 mg | SUBCUTANEOUS | 1 refills | Status: DC

## 2022-01-22 MED ORDER — AMPHETAMINE-DEXTROAMPHET ER 10 MG PO CP24
10.0000 mg | ORAL_CAPSULE | Freq: Every day | ORAL | 0 refills | Status: DC
  Filled 2022-01-22: qty 90, 90d supply, fill #0

## 2022-01-22 NOTE — Telephone Encounter (Signed)
Needs appt next month

## 2022-01-22 NOTE — Telephone Encounter (Signed)
Updated rx sent in.

## 2022-01-23 ENCOUNTER — Other Ambulatory Visit (HOSPITAL_COMMUNITY): Payer: Self-pay

## 2022-01-23 MED ORDER — SEMAGLUTIDE-WEIGHT MANAGEMENT 1.7 MG/0.75ML ~~LOC~~ SOAJ
1.7000 mg | SUBCUTANEOUS | 1 refills | Status: DC
  Filled 2022-01-23: qty 3, 28d supply, fill #0
  Filled 2022-04-19: qty 3, 28d supply, fill #1

## 2022-01-23 NOTE — Addendum Note (Signed)
Addended by: Chalmers Cater on: 01/23/2022 10:54 AM   Modules accepted: Orders

## 2022-03-07 DIAGNOSIS — Z85820 Personal history of malignant melanoma of skin: Secondary | ICD-10-CM | POA: Diagnosis not present

## 2022-03-07 DIAGNOSIS — I781 Nevus, non-neoplastic: Secondary | ICD-10-CM | POA: Diagnosis not present

## 2022-03-07 DIAGNOSIS — D225 Melanocytic nevi of trunk: Secondary | ICD-10-CM | POA: Diagnosis not present

## 2022-03-07 DIAGNOSIS — B351 Tinea unguium: Secondary | ICD-10-CM | POA: Diagnosis not present

## 2022-04-18 ENCOUNTER — Other Ambulatory Visit: Payer: Self-pay

## 2022-04-19 ENCOUNTER — Other Ambulatory Visit: Payer: Self-pay

## 2022-04-25 ENCOUNTER — Ambulatory Visit (INDEPENDENT_AMBULATORY_CARE_PROVIDER_SITE_OTHER): Payer: BC Managed Care – PPO | Admitting: Family Medicine

## 2022-04-25 ENCOUNTER — Encounter: Payer: Self-pay | Admitting: Family Medicine

## 2022-04-25 DIAGNOSIS — F900 Attention-deficit hyperactivity disorder, predominantly inattentive type: Secondary | ICD-10-CM | POA: Diagnosis not present

## 2022-04-25 DIAGNOSIS — B351 Tinea unguium: Secondary | ICD-10-CM | POA: Diagnosis not present

## 2022-04-25 DIAGNOSIS — R635 Abnormal weight gain: Secondary | ICD-10-CM

## 2022-04-25 DIAGNOSIS — Z5181 Encounter for therapeutic drug level monitoring: Secondary | ICD-10-CM | POA: Diagnosis not present

## 2022-04-25 MED ORDER — WEGOVY 2.4 MG/0.75ML ~~LOC~~ SOAJ
2.4000 mg | SUBCUTANEOUS | 0 refills | Status: DC
  Filled 2022-04-25: qty 9, 84d supply, fill #0
  Filled 2022-04-26 (×2): qty 3, 28d supply, fill #0

## 2022-04-25 MED ORDER — ONDANSETRON 4 MG PO TBDP
4.0000 mg | ORAL_TABLET | Freq: Three times a day (TID) | ORAL | 2 refills | Status: DC | PRN
  Filled 2022-04-25: qty 20, 7d supply, fill #0
  Filled 2023-02-24: qty 20, 7d supply, fill #1

## 2022-04-25 MED ORDER — AMPHETAMINE-DEXTROAMPHET ER 15 MG PO CP24
15.0000 mg | ORAL_CAPSULE | Freq: Every day | ORAL | 0 refills | Status: DC
  Filled 2022-04-25: qty 90, 90d supply, fill #0

## 2022-04-25 NOTE — Assessment & Plan Note (Signed)
Increasing Adderall XR to 15 mg daily.  She will let me know how this is working after a few weeks.

## 2022-04-25 NOTE — Assessment & Plan Note (Signed)
Overall she is doing well with Wegovy.  Increasing to 2.4 mg weekly.  Zofran as needed for nausea.

## 2022-04-25 NOTE — Assessment & Plan Note (Signed)
Currently on Sporanox under management of dermatology.  Updating LFTs today.

## 2022-04-25 NOTE — Progress Notes (Signed)
Julie Brandt - 32 y.o. female MRN [id number]  Date of birth: 1990-07-10  Subjective Chief Complaint  Patient presents with   Weight Loss Surgery    HPI Julie Brandt is a 32 y.o. female here today for follow-up visit.    She reports she is doing pretty well.  Continues on Wegovy to help with weight management.  Feels like she has plateaued at current strength.  She would like to increase this to the 2.4 mg strength.  She does have some nausea with this after first taking.  She would like Zofran called in with a refill.  She does continue to work on dietary changes and is moderately active.  Feels that she may need to increase her Adderall slightly.  Effects tend to run out early in the afternoon.  She has tolerated this well at current strength.  Currently on Sporanox for treatment of onychomycosis.  This was prescribed by dermatology.  Needs to have updated LFTs.  ROS:  A comprehensive ROS was completed and negative except as noted per HPI  Allergies  Allergen Reactions   Cephalosporins Hives and Rash    No cross reactions with penicillins    Past Medical History:  Diagnosis Date   BV (bacterial vaginosis)    Hx of abnormal cervical Papanicolaou smear     History reviewed. No pertinent surgical history.  Social History   Socioeconomic History   Marital status: Significant Other    Spouse name: Not on file   Number of children: Not on file   Years of education: Not on file   Highest education level: Not on file  Occupational History   Occupation: Surveyor, minerals  Tobacco Use   Smoking status: Never   Smokeless tobacco: Never  Vaping Use   Vaping Use: Never used  Substance and Sexual Activity   Alcohol use: Not on file   Drug use: Never   Sexual activity: Yes    Partners: Male    Birth control/protection: I.U.D.  Other Topics Concern   Not on file  Social History Narrative   Not on file   Social Determinants of Health   Financial Resource Strain: Not on  file  Food Insecurity: Not on file  Transportation Needs: Not on file  Physical Activity: Not on file  Stress: Not on file  Social Connections: Not on file    Family History  Problem Relation Age of Onset   Hypertension Mother    Skin cancer Mother    Hypertension Maternal Grandfather    Diabetes Maternal Grandfather    Stroke Maternal Grandfather    Skin cancer Maternal Grandfather    Prostate cancer Maternal Grandfather    Lung cancer Paternal Grandmother    Parkinson's disease Father 20    Health Maintenance  Topic Date Due   Hepatitis C Screening  08/29/2022 (Originally 02/10/2007)   HIV Screening  08/29/2022 (Originally 02/10/2004)   COVID-19 Vaccine (3 - 2023-24 season) 11/20/2022 (Originally 10/20/2021)   PAP SMEAR-Modifier  11/20/2022   DTaP/Tdap/Td (2 - Td or Tdap) 08/24/2027   INFLUENZA VACCINE  Completed   HPV VACCINES  Completed     ----------------------------------------------------------------------------------------------------------------------------------------------------------------------------------------------------------------- Physical Exam BP 130/82 (BP Location: Left Arm, Patient Position: Sitting, Cuff Size: Large)   Pulse 100   Ht '5\' 2"'$  (1.575 m)   Wt 232 lb (105.2 kg)   SpO2 97%   BMI 42.43 kg/m   Physical Exam Constitutional:      Appearance: Normal appearance.  Eyes:     General:  No scleral icterus. Neurological:     General: No focal deficit present.     Mental Status: She is alert and oriented to person, place, and time.     ------------------------------------------------------------------------------------------------------------------------------------------------------------------------------------------------------------------- Assessment and Plan  Attention deficit hyperactivity disorder (ADHD), predominantly inattentive type Increasing Adderall XR to 15 mg daily.  She will let me know how this is working after a few  weeks.  Obesity, morbid, BMI 40.0-49.9 ([place]) Overall she is doing well with Wegovy.  Increasing to 2.4 mg weekly.  Zofran as needed for nausea.    Onychomycosis Currently on Sporanox under management of dermatology.  Updating LFTs today.   Meds ordered this encounter  Medications   amphetamine-dextroamphetamine (ADDERALL XR) 15 MG 24 hr capsule    Sig: Take 1 capsule by mouth daily.    Dispense:  90 capsule    Refill:  0    Please mail to patient. Thanks   Semaglutide-Weight Management (WEGOVY) 2.4 MG/0.75ML SOAJ    Sig: Inject 2.4 mg into the skin once a week.    Dispense:  9 mL    Refill:  0   ondansetron (ZOFRAN-ODT) 4 MG disintegrating tablet    Sig: Take 1 tablet (4 mg total) by mouth every 8 (eight) hours as needed for nausea or vomiting.    Dispense:  20 tablet    Refill:  2    No follow-ups on file.    This visit occurred during the SARS-CoV-2 public health emergency.  Safety protocols were in place, including screening questions prior to the visit, additional usage of staff PPE, and extensive cleaning of exam room while observing appropriate contact time as indicated for disinfecting solutions.

## 2022-04-26 ENCOUNTER — Other Ambulatory Visit: Payer: Self-pay

## 2022-04-26 ENCOUNTER — Other Ambulatory Visit (HOSPITAL_COMMUNITY): Payer: Self-pay

## 2022-04-26 LAB — CBC WITH DIFFERENTIAL/PLATELET
Absolute Monocytes: 623 cells/uL (ref 200–950)
Basophils Absolute: 34 cells/uL (ref 0–200)
Basophils Relative: 0.5 %
Eosinophils Absolute: 74 cells/uL (ref 15–500)
Eosinophils Relative: 1.1 %
HCT: 39.2 % (ref 35.0–45.0)
Hemoglobin: 13.2 g/dL (ref 11.7–15.5)
Lymphs Abs: 1414 cells/uL (ref 850–3900)
MCH: 29.2 pg (ref 27.0–33.0)
MCHC: 33.7 g/dL (ref 32.0–36.0)
MCV: 86.7 fL (ref 80.0–100.0)
MPV: 11.6 fL (ref 7.5–12.5)
Monocytes Relative: 9.3 %
Neutro Abs: 4556 cells/uL (ref 1500–7800)
Neutrophils Relative %: 68 %
Platelets: 239 10*3/uL (ref 140–400)
RBC: 4.52 10*6/uL (ref 3.80–5.10)
RDW: 12.5 % (ref 11.0–15.0)
Total Lymphocyte: 21.1 %
WBC: 6.7 10*3/uL (ref 3.8–10.8)

## 2022-04-26 LAB — HEPATIC FUNCTION PANEL
AG Ratio: 1.6 (calc) (ref 1.0–2.5)
ALT: 15 U/L (ref 6–29)
AST: 14 U/L (ref 10–30)
Albumin: 4.4 g/dL (ref 3.6–5.1)
Alkaline phosphatase (APISO): 96 U/L (ref 31–125)
Bilirubin, Direct: 0.2 mg/dL (ref 0.0–0.2)
Globulin: 2.7 g/dL (calc) (ref 1.9–3.7)
Indirect Bilirubin: 0.8 mg/dL (calc) (ref 0.2–1.2)
Total Bilirubin: 1 mg/dL (ref 0.2–1.2)
Total Protein: 7.1 g/dL (ref 6.1–8.1)

## 2022-04-26 LAB — BASIC METABOLIC PANEL
BUN: 10 mg/dL (ref 7–25)
CO2: 25 mmol/L (ref 20–32)
Calcium: 9.1 mg/dL (ref 8.6–10.2)
Chloride: 106 mmol/L (ref 98–110)
Creat: 0.76 mg/dL (ref 0.50–0.97)
Glucose, Bld: 76 mg/dL (ref 65–99)
Potassium: 4.1 mmol/L (ref 3.5–5.3)
Sodium: 139 mmol/L (ref 135–146)

## 2022-04-26 LAB — HEMOGLOBIN A1C
Hgb A1c MFr Bld: 5.1 % of total Hgb (ref <5.7)
Mean Plasma Glucose: 100 mg/dL
eAG (mmol/L): 5.5 mmol/L

## 2022-04-26 LAB — TSH: TSH: 0.97 mIU/L

## 2022-05-02 ENCOUNTER — Other Ambulatory Visit (HOSPITAL_COMMUNITY): Payer: Self-pay

## 2022-05-10 ENCOUNTER — Other Ambulatory Visit: Payer: Self-pay

## 2022-05-10 ENCOUNTER — Encounter: Payer: Self-pay | Admitting: Family Medicine

## 2022-05-10 MED ORDER — ZEPBOUND 7.5 MG/0.5ML ~~LOC~~ SOAJ: 7.5000 mg | SUBCUTANEOUS | 0 refills | Status: DC

## 2022-05-10 MED ORDER — ZEPBOUND 5 MG/0.5ML ~~LOC~~ SOAJ: 5.0000 mg | SUBCUTANEOUS | 0 refills | Status: DC

## 2022-05-10 MED ORDER — ZEPBOUND 10 MG/0.5ML ~~LOC~~ SOAJ: 10.0000 mg | SUBCUTANEOUS | 0 refills | Status: DC

## 2022-05-10 NOTE — Addendum Note (Signed)
Addended by: Perlie Mayo on: 05/10/2022 04:56 PM   Modules accepted: Orders

## 2022-05-10 NOTE — Telephone Encounter (Signed)
Updated and sent to [facility]

## 2022-05-11 ENCOUNTER — Other Ambulatory Visit (HOSPITAL_COMMUNITY): Payer: Self-pay

## 2022-05-15 ENCOUNTER — Other Ambulatory Visit: Payer: Self-pay

## 2022-05-15 ENCOUNTER — Other Ambulatory Visit (HOSPITAL_COMMUNITY): Payer: Self-pay

## 2022-05-15 MED ORDER — WEGOVY 2.4 MG/0.75ML ~~LOC~~ SOAJ
2.4000 mg | SUBCUTANEOUS | 0 refills | Status: DC
  Filled 2022-05-15: qty 3, 28d supply, fill #0
  Filled 2022-06-20: qty 3, 28d supply, fill #1
  Filled 2022-08-05: qty 3, 28d supply, fill #2

## 2022-05-16 ENCOUNTER — Other Ambulatory Visit (HOSPITAL_COMMUNITY): Payer: Self-pay

## 2022-05-18 ENCOUNTER — Other Ambulatory Visit (HOSPITAL_COMMUNITY): Payer: Self-pay

## 2022-05-21 ENCOUNTER — Other Ambulatory Visit (HOSPITAL_COMMUNITY): Payer: Self-pay

## 2022-05-28 ENCOUNTER — Other Ambulatory Visit (HOSPITAL_COMMUNITY): Payer: Self-pay

## 2022-06-20 ENCOUNTER — Other Ambulatory Visit (HOSPITAL_COMMUNITY): Payer: Self-pay

## 2022-08-06 ENCOUNTER — Other Ambulatory Visit: Payer: Self-pay

## 2022-08-09 ENCOUNTER — Telehealth: Payer: Self-pay | Admitting: *Deleted

## 2022-08-09 NOTE — Telephone Encounter (Signed)
Returned call from 4:15 PM to schedule annual.

## 2022-08-09 NOTE — Telephone Encounter (Signed)
Returned call from 08/08/2022 at 8:31 AM, I was not in the office on this day. Left patient a message to call the office to schedule annual.

## 2022-09-12 DIAGNOSIS — L814 Other melanin hyperpigmentation: Secondary | ICD-10-CM | POA: Diagnosis not present

## 2022-09-12 DIAGNOSIS — B351 Tinea unguium: Secondary | ICD-10-CM | POA: Diagnosis not present

## 2022-09-12 DIAGNOSIS — Z1283 Encounter for screening for malignant neoplasm of skin: Secondary | ICD-10-CM | POA: Diagnosis not present

## 2022-09-12 DIAGNOSIS — Z85820 Personal history of malignant melanoma of skin: Secondary | ICD-10-CM | POA: Diagnosis not present

## 2022-09-18 ENCOUNTER — Other Ambulatory Visit (HOSPITAL_COMMUNITY): Payer: Self-pay

## 2022-09-18 ENCOUNTER — Other Ambulatory Visit: Payer: Self-pay | Admitting: Family Medicine

## 2022-09-18 MED ORDER — WEGOVY 2.4 MG/0.75ML ~~LOC~~ SOAJ
2.4000 mg | SUBCUTANEOUS | 0 refills | Status: DC
  Filled 2022-09-18: qty 3, 28d supply, fill #0

## 2022-09-19 ENCOUNTER — Other Ambulatory Visit: Payer: Self-pay

## 2022-10-30 ENCOUNTER — Encounter: Payer: Self-pay | Admitting: Obstetrics and Gynecology

## 2022-10-30 ENCOUNTER — Ambulatory Visit (INDEPENDENT_AMBULATORY_CARE_PROVIDER_SITE_OTHER): Payer: 59 | Admitting: Obstetrics and Gynecology

## 2022-10-30 ENCOUNTER — Other Ambulatory Visit (HOSPITAL_COMMUNITY)
Admission: RE | Admit: 2022-10-30 | Discharge: 2022-10-30 | Disposition: A | Discharge status: Home / Self Care | Payer: 59 | Source: Ambulatory Visit | Attending: Obstetrics and Gynecology | Admitting: Obstetrics and Gynecology

## 2022-10-30 VITALS — BP 131/90 | HR 109 | Ht 62.0 in | Wt 226.0 lb

## 2022-10-30 DIAGNOSIS — Z01419 Encounter for gynecological examination (general) (routine) without abnormal findings: Secondary | ICD-10-CM | POA: Diagnosis not present

## 2022-10-30 DIAGNOSIS — Z3202 Encounter for pregnancy test, result negative: Secondary | ICD-10-CM | POA: Diagnosis not present

## 2022-10-30 DIAGNOSIS — Z30433 Encounter for removal and reinsertion of intrauterine contraceptive device: Secondary | ICD-10-CM

## 2022-10-30 DIAGNOSIS — Z113 Encounter for screening for infections with a predominantly sexual mode of transmission: Secondary | ICD-10-CM | POA: Insufficient documentation

## 2022-10-30 LAB — POCT URINALYSIS DIPSTICK
Bilirubin, UA: NEGATIVE
Glucose, UA: NEGATIVE
Ketones, UA: NEGATIVE
Leukocytes, UA: NEGATIVE
Nitrite, UA: NEGATIVE
Protein, UA: NEGATIVE
Spec Grav, UA: 1.01 (ref 1.010–1.025)
Urobilinogen, UA: 0.2 U/dL
pH, UA: 5 (ref 5.0–8.0)

## 2022-10-30 LAB — POCT URINE PREGNANCY: Preg Test, Ur: NEGATIVE

## 2022-10-30 MED ORDER — LEVONORGESTREL 13.5 MG IU IUD
INTRAUTERINE_SYSTEM | Freq: Once | INTRAUTERINE | Status: AC
Start: 2022-10-30 — End: 2022-10-30

## 2022-10-30 NOTE — Progress Notes (Signed)
GYNECOLOGY ANNUAL PREVENTATIVE CARE ENCOUNTER NOTE  History:     Julie Brandt is a 32 y.o. G0P0000 female here for a routine annual gynecologic exam.  Current complaints: None. Here for removal of Kyleena IUD and insertion of new IUD. Uncertain of which type of IUD. She is planning pregnancy in the next 2-3 years.    Denies abnormal vaginal bleeding, discharge, pelvic pain, problems with intercourse or other gynecologic concerns.    Gynecologic History No LMP recorded. (Menstrual status: IUD). Contraception: IUD Last Pap: 2021. Result was normal with negative HPV Last Mammogram: NA   Obstetric History OB History  Gravida Para Term Preterm AB Living  0 0 0 0 0 0  SAB IAB Ectopic Multiple Live Births  0 0 0 0 0    Past Medical History:  Diagnosis Date   BV (bacterial vaginosis)    Hx of abnormal cervical Papanicolaou smear     History reviewed. No pertinent surgical history.  Current Outpatient Medications on File Prior to Visit  Medication Sig Dispense Refill   amphetamine-dextroamphetamine (ADDERALL XR) 15 MG 24 hr capsule Take 1 capsule by mouth daily. 90 capsule 0   cetirizine (ZYRTEC) 10 MG tablet Take 10 mg by mouth daily.     Fluticasone Propionate (FLONASE ALLERGY RELIEF NA) Place into the nose.     itraconazole (SPORANOX) 100 MG capsule Take 2 capsules (200 mg total) by mouth daily. 60 capsule 2   ketotifen (ALAWAY) 0.025 % ophthalmic solution 1 drop 2 (two) times daily.     levonorgestrel (KYLEENA) 19.5 MG IUD      LORATADINE PO Take by mouth.     ondansetron (ZOFRAN-ODT) 4 MG disintegrating tablet Take 1 tablet (4 mg total) by mouth every 8 (eight) hours as needed for nausea or vomiting. 20 tablet 2   Semaglutide-Weight Management (WEGOVY) 2.4 MG/0.75ML SOAJ Inject 2.4 mg into the skin once a week. 9 mL 0   No current facility-administered medications on file prior to visit.    Allergies  Allergen Reactions   Cephalosporins Hives and Rash    No cross  reactions with penicillins    Social History:  reports that she has never smoked. She has never used smokeless tobacco. She reports that she does not use drugs.  Family History  Problem Relation Age of Onset   Hypertension Mother    Skin cancer Mother    Hypertension Maternal Grandfather    Diabetes Maternal Grandfather    Stroke Maternal Grandfather    Skin cancer Maternal Grandfather    Prostate cancer Maternal Grandfather    Lung cancer Paternal Grandmother    Parkinson's disease Father 57    The following portions of the patient's history were reviewed and updated as appropriate: allergies, current medications, past family history, past medical history, past social history, past surgical history and problem list.  Review of Systems Pertinent items noted in HPI and remainder of comprehensive ROS otherwise negative.  Physical Exam:  BP (!) 131/90   Pulse (!) 109   Ht 5\' 2"  (1.575 m)   Wt 226 lb (102.5 kg)   BMI 41.34 kg/m  CONSTITUTIONAL: Well-developed, well-nourished female in no acute distress.  HENT:  Normocephalic, atraumatic, External right and left ear normal.  EYES: Conjunctivae and EOM are normal. Pupils are equal, round, and reactive to light. No scleral icterus.  NECK: Normal range of motion, supple, no masses.  Normal thyroid.  SKIN: Skin is warm and dry. No rash noted. Not diaphoretic. No  erythema. No pallor. MUSCULOSKELETAL: Normal range of motion. No tenderness.  No cyanosis, clubbing, or edema. NEUROLOGIC: Alert and oriented to person, place, and time. Normal reflexes, muscle tone coordination.  PSYCHIATRIC: Normal mood and affect. Normal behavior. Normal judgment and thought content. CARDIOVASCULAR: Normal heart rate noted, regular rhythm RESPIRATORY: Clear to auscultation bilaterally. Effort and breath sounds normal, no problems with respiration noted. BREASTS: Symmetric in size. No masses, tenderness, skin changes, nipple drainage, or lymphadenopathy  bilaterally. Performed in the presence of a chaperone. ABDOMEN: Soft, no distention noted.  No tenderness, rebound or guarding.  PELVIC: Normal appearing external genitalia and urethral meatus; normal appearing vaginal mucosa and cervix.  No abnormal vaginal discharge noted.  Pap smear obtained.  Normal uterine size, no other palpable masses, no uterine or adnexal tenderness.  Performed in the presence of a chaperone.   Assessment and Plan:    1. Well woman exam with routine gynecological exam  - Cytology - PAP( [place]) - POCT urine pregnancy - Recommend preconception visit prior to pregnancy. - Plan for Home BP monitoring with log. Plan for prenatal vitamins with an additional folic acid 1-4 mg. - Plan for labs including PCR, TSH, HgA1c, CBC, CMP.   2. Screening examination for STD (sexually transmitted disease)  - POCT Urinalysis Dipstick - Cervicovaginal ancillary only( [place])  Will follow up results of pap smear and manage accordingly. Normal breast examination today, she was advised to perform periodic self breast examinations.       GYNECOLOGY OFFICE PROCEDURE NOTE  Julie Brandt is a 32 y.o. G0P0000 here for Kyleena UD removal and Skyla reinsertion. No GYN concerns.  Last pap smear was on 2021 and was normal.  Undecided initially about type of IUD for insertion. Concerned the Mirena IUD will cause worsening ocular migraines. She has done well with the [facility] with improvement in migraines. She is planning pregnancy in the next 3 years and is leaning towards [place].   IUD Removal and Reinsertion  Patient identified, informed consent performed, consent signed.   Chaperone present.   Discussed risks of irregular bleeding, cramping, infection, malpositioning or misplacement of the IUD outside the uterus which may require further procedures. Also discussed >99% contraception efficacy, increased risk of ectopic pregnancy with failure of method.   Emphasized that this did not  protect against STIs, condoms recommended during all sexual encounters.Advised to use backup contraception for one week as the risk of pregnancy is higher during the transition period of removing an IUD and replacing it with another one. Time out was performed. Speculum placed in the vagina. The strings of the IUD were grasped and pulled using ring forceps. The IUD was successfully removed in its entirety. The cervix was cleaned with Betadine x 2 and grasped anteriorly with a single tooth tenaculum.  The new Skyla IUD insertion apparatus was used to sound the uterus to 7 cm;  the IUD was then placed per manufacturer's recommendations. Strings trimmed to 3 cm. Tenaculum was removed, good hemostasis noted. Patient tolerated procedure well.   Patient was given post-procedure instructions.  She was reminded to have backup contraception for one week during this transition period between IUDs.  Patient was also asked to check IUD strings periodically.    Julie Brandt, Harolyn Rutherford, NP [facility] [employer], [facility]

## 2022-10-31 ENCOUNTER — Other Ambulatory Visit (HOSPITAL_COMMUNITY): Payer: Self-pay

## 2022-10-31 ENCOUNTER — Encounter: Payer: Self-pay | Admitting: Family Medicine

## 2022-10-31 ENCOUNTER — Ambulatory Visit (INDEPENDENT_AMBULATORY_CARE_PROVIDER_SITE_OTHER): Payer: 59 | Admitting: Family Medicine

## 2022-10-31 ENCOUNTER — Other Ambulatory Visit: Payer: Self-pay

## 2022-10-31 VITALS — BP 109/75 | HR 103 | Ht 62.0 in | Wt 229.0 lb

## 2022-10-31 DIAGNOSIS — Z23 Encounter for immunization: Secondary | ICD-10-CM

## 2022-10-31 DIAGNOSIS — B351 Tinea unguium: Secondary | ICD-10-CM

## 2022-10-31 DIAGNOSIS — F900 Attention-deficit hyperactivity disorder, predominantly inattentive type: Secondary | ICD-10-CM

## 2022-10-31 LAB — CERVICOVAGINAL ANCILLARY ONLY
Bacterial Vaginitis (gardnerella): NEGATIVE
Candida Glabrata: NEGATIVE
Candida Vaginitis: NEGATIVE
Chlamydia: NEGATIVE
Comment: NEGATIVE
Comment: NEGATIVE
Comment: NEGATIVE
Comment: NEGATIVE
Comment: NEGATIVE
Comment: NORMAL
Neisseria Gonorrhea: NEGATIVE
Trichomonas: NEGATIVE

## 2022-10-31 MED ORDER — ITRACONAZOLE 100 MG PO CAPS
200.0000 mg | ORAL_CAPSULE | Freq: Every day | ORAL | 0 refills | Status: DC
Start: 2022-10-31 — End: 2023-01-30
  Filled 2022-10-31: qty 60, 30d supply, fill #0
  Filled 2022-12-17: qty 60, 30d supply, fill #1
  Filled 2023-01-22: qty 48, 24d supply, fill #2

## 2022-10-31 MED ORDER — ZEPBOUND 7.5 MG/0.5ML ~~LOC~~ SOAJ
7.5000 mg | SUBCUTANEOUS | 0 refills | Status: DC
  Filled 2022-10-31 – 2022-11-03 (×2): qty 2, 28d supply, fill #0

## 2022-10-31 MED ORDER — LISDEXAMFETAMINE DIMESYLATE 30 MG PO CAPS
30.0000 mg | ORAL_CAPSULE | Freq: Every day | ORAL | 0 refills | Status: DC
  Filled 2022-10-31: qty 90, 90d supply, fill #0

## 2022-10-31 MED ORDER — ZEPBOUND 10 MG/0.5ML ~~LOC~~ SOAJ
10.0000 mg | SUBCUTANEOUS | 3 refills | Status: DC
  Filled 2022-10-31 – 2022-12-17 (×2): qty 2, 28d supply, fill #0
  Filled 2023-01-22: qty 2, 28d supply, fill #1
  Filled 2023-02-24: qty 2, 28d supply, fill #2
  Filled 2023-03-28: qty 2, 28d supply, fill #3

## 2022-10-31 NOTE — Progress Notes (Signed)
Julie Brandt - 32 y.o. female MRN [id number]  Date of birth: 04/29/1990  Subjective Chief Complaint  Patient presents with   Follow-up    HPI Julie Brandt is a 32 y.o. female here today for follow-up visit.  She remains on Vyvanse 30 mg daily.  She feels this is working pretty well for her.  She has not had any notable side effects with this.  She is sleeping okay without increased anxiety.  She has been on Wegovy 2.4 mg weekly.  She feels like she has plateaued at current strength.  She would be interested in changing to Zepbound.  Overall she has tolerated Wegovy pretty well.  Has had recurrence of nail fungus.  Previously treated with itraconazole and had improvement however now having recurrence.  Tolerating itraconazole well previously.  ROS:  A comprehensive ROS was completed and negative except as noted per HPI  Allergies  Allergen Reactions   Cephalosporins Hives and Rash    No cross reactions with penicillins    Past Medical History:  Diagnosis Date   BV (bacterial vaginosis)    Hx of abnormal cervical Papanicolaou smear     History reviewed. No pertinent surgical history.  Social History   Socioeconomic History   Marital status: Significant Other    Spouse name: Not on file   Number of children: Not on file   Years of education: Not on file   Highest education level: Not on file  Occupational History   Occupation: Doctor, general practice  Tobacco Use   Smoking status: Never   Smokeless tobacco: Never  Vaping Use   Vaping status: Never Used  Substance and Sexual Activity   Alcohol use: Not on file   Drug use: Never   Sexual activity: Yes    Partners: Male    Birth control/protection: I.U.D.  Other Topics Concern   Not on file  Social History Narrative   Not on file   Social Determinants of Health   Financial Resource Strain: Not on file  Food Insecurity: Not on file  Transportation Needs: Not on file  Physical Activity: Not on file  Stress: Not on  file  Social Connections: Not on file    Family History  Problem Relation Age of Onset   Hypertension Mother    Skin cancer Mother    Hypertension Maternal Grandfather    Diabetes Maternal Grandfather    Stroke Maternal Grandfather    Skin cancer Maternal Grandfather    Prostate cancer Maternal Grandfather    Lung cancer Paternal Grandmother    Parkinson's disease Father 24    Health Maintenance  Topic Date Due   HIV Screening  Never done   Hepatitis C Screening  Never done   INFLUENZA VACCINE  05/20/2023 (Originally 09/20/2022)   PAP SMEAR-Modifier  10/29/2025   DTaP/Tdap/Td (2 - Td or Tdap) 08/24/2027   HPV VACCINES  Completed   COVID-19 Vaccine  Completed     ----------------------------------------------------------------------------------------------------------------------------------------------------------------------------------------------------------------- Physical Exam BP 109/75 (BP Location: Right Arm, Patient Position: Sitting, Cuff Size: Large)   Pulse (!) 103   Ht 5\' 2"  (1.575 m)   Wt 229 lb (103.9 kg)   SpO2 98%   BMI 41.88 kg/m   Physical Exam Constitutional:      Appearance: Normal appearance.  HENT:     Head: Normocephalic and atraumatic.  Cardiovascular:     Rate and Rhythm: Normal rate and regular rhythm.  Pulmonary:     Effort: Pulmonary effort is normal.     Breath  sounds: Normal breath sounds.  Neurological:     Mental Status: She is alert.  Psychiatric:        Mood and Affect: Mood normal.        Behavior: Behavior normal.     ------------------------------------------------------------------------------------------------------------------------------------------------------------------------------------------------------------------- Assessment and Plan  Attention deficit hyperactivity disorder (ADHD), predominantly inattentive type Plan to continue Vyvanse at 30 mg daily.  Onychomycosis She did have improvement after  completion of Sporanox previously.  Now with recurrence, will repeat course of Sporanox.  Update LFTs today.  Obesity, morbid, BMI 40.0-49.9 ([facility]) She was doing pretty well with [facility] however felt like she has had a plateau.  Switching to Zepbound.  Encouraged continued dietary change and incorporation of regular exercise.   Meds ordered this encounter  Medications   itraconazole (SPORANOX) 100 MG capsule    Sig: Take 2 capsules (200 mg total) by mouth daily.    Dispense:  168 capsule    Refill:  0   lisdexamfetamine (VYVANSE) 30 MG capsule    Sig: Take 1 capsule (30 mg total) by mouth daily.    Dispense:  90 capsule    Refill:  0   tirzepatide (ZEPBOUND) 7.5 MG/0.5ML Pen    Sig: Inject 7.5 mg into the skin once a week.    Dispense:  2 mL    Refill:  0   tirzepatide (ZEPBOUND) 10 MG/0.5ML Pen    Sig: Inject 10 mg into the skin once a week.    Dispense:  2 mL    Refill:  3    Return in about 3 months (around 01/30/2023) for Weight mgmt, ADHD.    This visit occurred during the SARS-CoV-2 public health emergency.  Safety protocols were in place, including screening questions prior to the visit, additional usage of staff PPE, and extensive cleaning of exam room while observing appropriate contact time as indicated for disinfecting solutions.

## 2022-11-01 ENCOUNTER — Other Ambulatory Visit: Payer: Self-pay

## 2022-11-01 LAB — HEPATIC FUNCTION PANEL
ALT: 18 IU/L (ref 0–32)
AST: 18 IU/L (ref 0–40)
Albumin: 4.4 g/dL (ref 3.9–4.9)
Alkaline Phosphatase: 91 IU/L (ref 44–121)
Bilirubin Total: 0.7 mg/dL (ref 0.0–1.2)
Bilirubin, Direct: 0.17 mg/dL (ref 0.00–0.40)
Total Protein: 6.6 g/dL (ref 6.0–8.5)

## 2022-11-01 LAB — CYTOLOGY - PAP
Comment: NEGATIVE
Diagnosis: NEGATIVE
High risk HPV: NEGATIVE

## 2022-11-04 ENCOUNTER — Other Ambulatory Visit (HOSPITAL_COMMUNITY): Payer: Self-pay

## 2022-11-04 NOTE — Assessment & Plan Note (Signed)
Plan to continue Vyvanse at 30 mg daily.

## 2022-11-04 NOTE — Assessment & Plan Note (Signed)
She was doing pretty well with [facility] however felt like she has had a plateau.  Switching to Zepbound.  Encouraged continued dietary change and incorporation of regular exercise.

## 2022-11-04 NOTE — Assessment & Plan Note (Signed)
She did have improvement after completion of Sporanox previously.  Now with recurrence, will repeat course of Sporanox.  Update LFTs today.

## 2022-11-05 ENCOUNTER — Other Ambulatory Visit: Payer: Self-pay

## 2022-11-05 ENCOUNTER — Other Ambulatory Visit (HOSPITAL_COMMUNITY): Payer: Self-pay

## 2022-11-08 ENCOUNTER — Encounter: Payer: Self-pay | Admitting: Family Medicine

## 2022-11-09 ENCOUNTER — Encounter (HOSPITAL_COMMUNITY): Payer: Self-pay

## 2022-11-09 ENCOUNTER — Other Ambulatory Visit (HOSPITAL_COMMUNITY): Payer: Self-pay

## 2022-11-09 MED ORDER — LISDEXAMFETAMINE DIMESYLATE 10 MG PO CAPS
10.0000 mg | ORAL_CAPSULE | Freq: Every day | ORAL | 0 refills | Status: DC
Start: 2022-11-09 — End: 2022-12-17
  Filled 2022-11-09: qty 30, 30d supply, fill #0

## 2022-11-09 MED ORDER — LISDEXAMFETAMINE DIMESYLATE 10 MG PO CAPS: 10.0000 mg | ORAL_CAPSULE | Freq: Every day | ORAL | 0 refills | Status: DC

## 2022-11-09 NOTE — Addendum Note (Signed)
Addended by: Mammie Lorenzo on: 11/09/2022 11:27 AM   Modules accepted: Orders

## 2022-11-09 NOTE — Addendum Note (Signed)
Addended by: Chalmers Cater on: 11/09/2022 10:44 AM   Modules accepted: Orders

## 2022-11-15 ENCOUNTER — Other Ambulatory Visit (HOSPITAL_COMMUNITY): Payer: Self-pay

## 2022-11-15 ENCOUNTER — Telehealth: Payer: Self-pay

## 2022-11-15 ENCOUNTER — Encounter: Payer: Self-pay | Admitting: Family Medicine

## 2022-11-15 NOTE — Telephone Encounter (Signed)
Initiated Prior authorization ZOX:[id number] Dimesylate 10MG  capsules Via: Covermymeds Case/Key:[id number] Status: approved  as of 11/15/22 Reason:Authorization Expiration Date: 11/14/2025  Notified Pt via: Mychart   Initiated Prior authorization WGN:[id number] 7.5MG /0.5ML pen-injectors Via: Covermymeds Case/Key:[id number] Status: approved  as of 11/15/22 Reason:Authorization Expiration Date: 07/13/2023 Notified Pt via: Mychart

## 2022-11-16 ENCOUNTER — Other Ambulatory Visit: Payer: Self-pay

## 2022-11-16 ENCOUNTER — Other Ambulatory Visit (HOSPITAL_COMMUNITY): Payer: Self-pay

## 2022-12-05 ENCOUNTER — Other Ambulatory Visit: Payer: Self-pay

## 2022-12-05 ENCOUNTER — Ambulatory Visit
Admission: RE | Admit: 2022-12-05 | Discharge: 2022-12-05 | Disposition: A | Discharge status: Home / Self Care | Payer: 59 | Source: Ambulatory Visit | Attending: Family Medicine | Admitting: Family Medicine

## 2022-12-05 VITALS — BP 118/81 | HR 79 | Temp 97.9°F | Resp 19

## 2022-12-05 DIAGNOSIS — H5789 Other specified disorders of eye and adnexa: Secondary | ICD-10-CM | POA: Diagnosis not present

## 2022-12-05 DIAGNOSIS — S0502XA Injury of conjunctiva and corneal abrasion without foreign body, left eye, initial encounter: Secondary | ICD-10-CM | POA: Diagnosis not present

## 2022-12-05 NOTE — Discharge Instructions (Addendum)
Use the tobramycin eyedrops 4 times a day in the left eye until symptoms have improved.  Likely 3 to 5 days

## 2022-12-05 NOTE — ED Triage Notes (Signed)
This morning got scratched in the left eye by cat. No blurry vision but eye is irritated.

## 2022-12-05 NOTE — ED Provider Notes (Signed)
[facility]    CSN: [id number] Arrival date & time: 12/05/22  1328      History   Chief Complaint Chief Complaint  Patient presents with   Eye Problem    HPI Julie Brandt is a 32 y.o. female.   HPI  Patient is here for irritation in her left eye.  She states that last night while in bed a cat came alarmed and ran across her face.  She has scratch on her ear and an irritation in her eye.  She wants make sure she does not have a corneal abrasion.  Her vision is intact.  No photophobia.  No tearing.  No discharge.  Past Medical History:  Diagnosis Date   BV (bacterial vaginosis)    Hx of abnormal cervical Papanicolaou smear     Patient Active Problem List   Diagnosis Date Noted   Onychomycosis 04/25/2022   GAD (generalized anxiety disorder) 05/23/2020   Well adult exam 12/17/2019   History of IBS 10/29/2019   History of migraine with aura 10/29/2019   Obesity, morbid, BMI 40.0-49.9 (HCC) 10/29/2019   IUD (intrauterine device) in place 10/29/2019   Supraclavicular lymphadenopathy 04/27/2019   Attention deficit hyperactivity disorder (ADHD), predominantly inattentive type 06/05/2018    History reviewed. No pertinent surgical history.  OB History     Gravida  0   Para  0   Term  0   Preterm  0   AB  0   Living  0      SAB  0   IAB  0   Ectopic  0   Multiple  0   Live Births  0            Home Medications    Prior to Admission medications   Medication Sig Start Date End Date Taking? Authorizing Provider  cetirizine (ZYRTEC) 10 MG tablet Take 10 mg by mouth daily.    [provider]  Fluticasone Propionate (FLONASE ALLERGY RELIEF NA) Place into the nose.    [provider]  itraconazole (SPORANOX) 100 MG capsule Take 2 capsules (200 mg total) by mouth daily. 10/31/22 01/23/23  Everrett Coombe, DO  ketotifen (ALAWAY) 0.025 % ophthalmic solution 1 drop 2 (two) times daily.    [provider]  lisdexamfetamine  (VYVANSE) 10 MG capsule Take 1 capsule (10 mg total) by mouth daily. 11/09/22   Everrett Coombe, DO  ondansetron (ZOFRAN-ODT) 4 MG disintegrating tablet Take 1 tablet (4 mg total) by mouth every 8 (eight) hours as needed for nausea or vomiting. 04/25/22   Everrett Coombe, DO  tirzepatide (ZEPBOUND) 10 MG/0.5ML Pen Inject 10 mg into the skin once a week. 10/31/22   Everrett Coombe, DO    Family History Family History  Problem Relation Age of Onset   Hypertension Mother    Skin cancer Mother    Hypertension Maternal Grandfather    Diabetes Maternal Grandfather    Stroke Maternal Grandfather    Skin cancer Maternal Grandfather    Prostate cancer Maternal Grandfather    Lung cancer Paternal Grandmother    Parkinson's disease Father 73    Social History Social History   Tobacco Use   Smoking status: Never   Smokeless tobacco: Never  Vaping Use   Vaping status: Never Used  Substance Use Topics   Drug use: Never     Allergies   Cephalosporins   Review of Systems Review of Systems See HPI  Physical Exam Triage Vital Signs ED Triage  Vitals  Encounter Vitals Group     BP 12/05/22 1335 118/81     Systolic BP Percentile --      Diastolic BP Percentile --      Pulse Rate 12/05/22 1335 79     Resp 12/05/22 1335 19     Temp 12/05/22 1335 97.9 F (36.6 C)     Temp src --      SpO2 12/05/22 1335 99 %     Weight --      Height --      Head Circumference --      Peak Flow --      Pain Score 12/05/22 1338 2     Pain Loc --      Pain Education --      Exclude from Growth Chart --    No data found.  Updated Vital Signs BP 118/81   Pulse 79   Temp 97.9 F (36.6 C)   Resp 19   SpO2 99%   Visual Acuity Right Eye Distance: 20/25 Left Eye Distance: 20/40 Bilateral Distance: 20/25      Physical Exam Constitutional:      General: She is not in acute distress.    Appearance: She is well-developed.  HENT:     Head: Normocephalic and atraumatic.  Eyes:      Conjunctiva/sclera:     Left eye: Left conjunctiva is injected.     Pupils: Pupils are equal, round, and reactive to light.   Cardiovascular:     Rate and Rhythm: Normal rate.  Pulmonary:     Effort: Pulmonary effort is normal. No respiratory distress.  Abdominal:     General: There is no distension.     Palpations: Abdomen is soft.  Musculoskeletal:        General: Normal range of motion.     Cervical back: Normal range of motion.  Skin:    General: Skin is warm and dry.  Neurological:     Mental Status: She is alert.      UC Treatments / Results  Labs (all labs ordered are listed, but only abnormal results are displayed) Labs Reviewed - No data to display  EKG   Radiology No results found.  Procedures Procedures (including critical care time)  Medications Ordered in UC Medications - No data to display  Initial Impression / Assessment and Plan / UC Course  I have reviewed the triage vital signs and the nursing notes.  Pertinent labs & imaging results that were available during my care of the patient were reviewed by me and considered in my medical decision making (see chart for details).     Home care discussed.  Reassured that this should heal completely in labor with no difficulty over the next 72 hours Final Clinical Impressions(s) / UC Diagnoses   Final diagnoses:  Abrasion of left cornea, initial encounter     Discharge Instructions      Use the tobramycin eyedrops 4 times a day in the left eye until symptoms have improved.  Likely 3 to 5 days   ED Prescriptions   None    PDMP not reviewed this encounter.   Eustace Moore, MD 12/05/22 [phone]

## 2022-12-17 ENCOUNTER — Other Ambulatory Visit: Payer: Self-pay | Admitting: Family Medicine

## 2022-12-18 ENCOUNTER — Other Ambulatory Visit: Payer: Self-pay

## 2022-12-18 ENCOUNTER — Other Ambulatory Visit (HOSPITAL_COMMUNITY): Payer: Self-pay

## 2022-12-18 MED ORDER — LISDEXAMFETAMINE DIMESYLATE 10 MG PO CAPS
10.0000 mg | ORAL_CAPSULE | Freq: Every day | ORAL | 0 refills | Status: DC
Start: 2022-12-18 — End: 2023-01-30
  Filled 2022-12-18: qty 30, 30d supply, fill #0

## 2022-12-18 MED ORDER — LISDEXAMFETAMINE DIMESYLATE 10 MG PO CAPS: 10.0000 mg | ORAL_CAPSULE | Freq: Every day | ORAL | 0 refills | Status: DC

## 2023-01-06 ENCOUNTER — Encounter: Payer: Self-pay | Admitting: Family Medicine

## 2023-01-06 DIAGNOSIS — Z5181 Encounter for therapeutic drug level monitoring: Secondary | ICD-10-CM

## 2023-01-16 DIAGNOSIS — Z5181 Encounter for therapeutic drug level monitoring: Secondary | ICD-10-CM | POA: Diagnosis not present

## 2023-01-17 LAB — HEPATIC FUNCTION PANEL
ALT: 19 [IU]/L (ref 0–32)
AST: 18 [IU]/L (ref 0–40)
Albumin: 4.4 g/dL (ref 3.9–4.9)
Alkaline Phosphatase: 93 [IU]/L (ref 44–121)
Bilirubin Total: 1.2 mg/dL (ref 0.0–1.2)
Bilirubin, Direct: 0.3 mg/dL (ref 0.00–0.40)
Total Protein: 6.8 g/dL (ref 6.0–8.5)

## 2023-01-23 ENCOUNTER — Other Ambulatory Visit: Payer: Self-pay

## 2023-01-23 ENCOUNTER — Other Ambulatory Visit (HOSPITAL_COMMUNITY): Payer: Self-pay

## 2023-01-30 ENCOUNTER — Ambulatory Visit (INDEPENDENT_AMBULATORY_CARE_PROVIDER_SITE_OTHER): Payer: 59 | Admitting: Family Medicine

## 2023-01-30 ENCOUNTER — Encounter: Payer: Self-pay | Admitting: Family Medicine

## 2023-01-30 ENCOUNTER — Other Ambulatory Visit (HOSPITAL_COMMUNITY): Payer: Self-pay

## 2023-01-30 ENCOUNTER — Other Ambulatory Visit: Payer: Self-pay

## 2023-01-30 VITALS — BP 116/84 | HR 77 | Ht 62.0 in | Wt 225.0 lb

## 2023-01-30 DIAGNOSIS — F900 Attention-deficit hyperactivity disorder, predominantly inattentive type: Secondary | ICD-10-CM

## 2023-01-30 DIAGNOSIS — B351 Tinea unguium: Secondary | ICD-10-CM

## 2023-01-30 MED ORDER — AMPHETAMINE-DEXTROAMPHET ER 15 MG PO CP24
15.0000 mg | ORAL_CAPSULE | ORAL | 0 refills | Status: DC
  Filled 2023-01-30: qty 30, 30d supply, fill #0

## 2023-01-30 MED ORDER — AMPHETAMINE-DEXTROAMPHETAMINE 5 MG PO TABS
5.0000 mg | ORAL_TABLET | Freq: Every day | ORAL | 0 refills | Status: DC
  Filled 2023-01-30: qty 15, 15d supply, fill #0

## 2023-01-30 MED ORDER — ITRACONAZOLE 100 MG PO CAPS
200.0000 mg | ORAL_CAPSULE | Freq: Every day | ORAL | 0 refills | Status: DC
  Filled 2023-01-30: qty 84, fill #0
  Filled 2023-03-28: qty 28, 14d supply, fill #0
  Filled 2023-05-28 – 2023-06-03 (×3): qty 28, 14d supply, fill #1
  Filled 2023-07-05 (×2): qty 28, 14d supply, fill #2

## 2023-01-30 NOTE — Progress Notes (Signed)
Julie Brandt - 32 y.o. female MRN [id number]  Date of birth: 07/23/1990  Subjective Chief Complaint  Patient presents with   Weight Loss    HPI Julie Brandt is a 32 y.o. female here today for follow up.   She still feels that she has hit a plateau with Zepbound.  Overall she is tolerated this well and has not gained weight.  She is incorporating increased acitivity and continuing to watch portions.    She is still dealing with toenail fungus.  She has use sporanox and had improvement but symptoms return once she stops.  She would like to see Podiatry.   She feels that she is more irritable with Vyvanse.  It does seem to have better efficacy with Adderall XR.  ROS:  A comprehensive ROS was completed and negative except as noted per HPI  Allergies  Allergen Reactions   Cephalosporins Hives and Rash    No cross reactions with penicillins    Past Medical History:  Diagnosis Date   BV (bacterial vaginosis)    Hx of abnormal cervical Papanicolaou smear     No past surgical history on file.  Social History   Socioeconomic History   Marital status: Significant Other    Spouse name: Not on file   Number of children: Not on file   Years of education: Not on file   Highest education level: Not on file  Occupational History   Occupation: Doctor, general practice  Tobacco Use   Smoking status: Never   Smokeless tobacco: Never  Vaping Use   Vaping status: Never Used  Substance and Sexual Activity   Alcohol use: Not on file   Drug use: Never   Sexual activity: Yes    Partners: Male    Birth control/protection: I.U.D.  Other Topics Concern   Not on file  Social History Narrative   Not on file   Social Determinants of Health   Financial Resource Strain: Not on file  Food Insecurity: Not on file  Transportation Needs: Not on file  Physical Activity: Not on file  Stress: Not on file  Social Connections: Not on file    Family History  Problem Relation Age of Onset    Hypertension Mother    Skin cancer Mother    Hypertension Maternal Grandfather    Diabetes Maternal Grandfather    Stroke Maternal Grandfather    Skin cancer Maternal Grandfather    Prostate cancer Maternal Grandfather    Lung cancer Paternal Grandmother    Parkinson's disease Father 52    Health Maintenance  Topic Date Due   HIV Screening  Never done   Hepatitis C Screening  Never done   INFLUENZA VACCINE  05/20/2023 (Originally 09/20/2022)   DTaP/Tdap/Td (2 - Td or Tdap) 08/24/2027   Cervical Cancer Screening (HPV/Pap Cotest)  10/30/2027   HPV VACCINES  Completed   COVID-19 Vaccine  Completed     ----------------------------------------------------------------------------------------------------------------------------------------------------------------------------------------------------------------- Physical Exam There were no vitals taken for this visit.  Physical Exam Constitutional:      Appearance: Normal appearance.  Eyes:     General: No scleral icterus. Musculoskeletal:     Cervical back: Neck supple.  Skin:    Comments: Dystrophic appearing great toenail.    Neurological:     Mental Status: She is alert.  Psychiatric:        Mood and Affect: Mood normal.        Behavior: Behavior normal.     ------------------------------------------------------------------------------------------------------------------------------------------------------------------------------------------------------------------- Assessment and Plan  Attention deficit  hyperactivity disorder (ADHD), predominantly inattentive type Undesirable side effects from vyvanse.  Adding adderall xr 15mg  with an additional 5mg  IR in the afternoon as needed.    Obesity, morbid, BMI 40.0-49.9 (HCC) Doing well with Zepbound at current strneght. Working on increased activity level.    Onychomycosis Repeat sporanox and will change to pulsed therapy.  Referral placed to podiatry as well.    Meds  ordered this encounter  Medications   itraconazole (SPORANOX) 100 MG capsule    Sig: Take 200mg  (2 tabs) daily x 1 week.  Repeat every 2 weeks for 3 months    Dispense:  84 capsule    Refill:  0   amphetamine-dextroamphetamine (ADDERALL XR) 15 MG 24 hr capsule    Sig: Take 1 capsule by mouth every morning.    Dispense:  30 capsule    Refill:  0   amphetamine-dextroamphetamine (ADDERALL) 5 MG tablet    Sig: Take 1 tablet (5 mg total) by mouth daily. Take in the afternoon as needed.    Dispense:  15 tablet    Refill:  0    Return in about 3 months (around 04/30/2023) for ADHD.    This visit occurred during the SARS-CoV-2 public health emergency.  Safety protocols were in place, including screening questions prior to the visit, additional usage of staff PPE, and extensive cleaning of exam room while observing appropriate contact time as indicated for disinfecting solutions.

## 2023-01-30 NOTE — Patient Instructions (Addendum)
Let's try pulsed therapy for the toenail. Take 200mg  daily x1 week, repeat every 2 weeks for 3 months.

## 2023-01-30 NOTE — Assessment & Plan Note (Signed)
Repeat sporanox and will change to pulsed therapy.  Referral placed to podiatry as well.

## 2023-01-30 NOTE — Assessment & Plan Note (Signed)
Doing well with Zepbound at current strneght. Working on increased activity level.

## 2023-01-30 NOTE — Assessment & Plan Note (Signed)
Undesirable side effects from vyvanse.  Adding adderall xr 15mg  with an additional 5mg  IR in the afternoon as needed.

## 2023-01-31 ENCOUNTER — Other Ambulatory Visit: Payer: Self-pay

## 2023-02-21 ENCOUNTER — Other Ambulatory Visit (HOSPITAL_COMMUNITY): Payer: Self-pay

## 2023-02-24 ENCOUNTER — Other Ambulatory Visit (HOSPITAL_COMMUNITY): Payer: Self-pay

## 2023-02-25 ENCOUNTER — Other Ambulatory Visit: Payer: Self-pay

## 2023-02-25 ENCOUNTER — Other Ambulatory Visit (HOSPITAL_COMMUNITY): Payer: Self-pay

## 2023-03-06 ENCOUNTER — Other Ambulatory Visit: Payer: Self-pay

## 2023-03-06 ENCOUNTER — Telehealth: Payer: BC Managed Care – PPO | Admitting: Family Medicine

## 2023-03-06 ENCOUNTER — Encounter: Payer: Self-pay | Admitting: Family Medicine

## 2023-03-06 DIAGNOSIS — F900 Attention-deficit hyperactivity disorder, predominantly inattentive type: Secondary | ICD-10-CM

## 2023-03-06 DIAGNOSIS — G4700 Insomnia, unspecified: Secondary | ICD-10-CM | POA: Diagnosis not present

## 2023-03-06 MED ORDER — VORTIOXETINE HBR 10 MG PO TABS
10.0000 mg | ORAL_TABLET | Freq: Every day | ORAL | 3 refills | Status: DC
  Filled 2023-03-06: qty 30, 30d supply, fill #0
  Filled 2023-04-07: qty 30, 30d supply, fill #1
  Filled 2023-04-29: qty 30, 30d supply, fill #2
  Filled 2023-05-28 – 2023-05-31 (×2): qty 30, 30d supply, fill #3

## 2023-03-06 MED ORDER — LISDEXAMFETAMINE DIMESYLATE 10 MG PO CAPS
10.0000 mg | ORAL_CAPSULE | Freq: Every day | ORAL | 0 refills | Status: DC
  Filled 2023-03-06: qty 90, 90d supply, fill #0
  Filled 2023-03-13: qty 30, 30d supply, fill #0
  Filled 2023-04-29: qty 30, 30d supply, fill #1

## 2023-03-06 MED ORDER — TRAZODONE HCL 100 MG PO TABS
100.0000 mg | ORAL_TABLET | Freq: Every day | ORAL | 0 refills | Status: AC
  Filled 2023-03-06: qty 30, 30d supply, fill #0

## 2023-03-06 NOTE — Progress Notes (Signed)
 Julie Brandt - 33 y.o. female MRN [id number]  Date of birth: 04/13/1990   This visit type was conducted due to national recommendations for restrictions regarding the COVID-19 Pandemic (e.g. social distancing).  This format is felt to be most appropriate for this patient at this time.  All issues noted in this document were discussed and addressed.  No physical exam was performed (except for noted visual exam findings with Video Visits).  I discussed the limitations of evaluation and management by telemedicine and the availability of in person appointments. The patient expressed understanding and agreed to proceed.  I connected withNAME@ on 03/06/23 at  2:30 PM EST by a video enabled telemedicine application and verified that I am speaking with the correct person using two identifiers.  Present at visit: Adela Holter, DO Kalayna Taddei   Patient Location: Home [address] [place] [address]   Provider location:   [facility]  Chief Complaint  Patient presents with   Insomnia    HPI  Julie Brandt is a 33 y.o. female who presents via audio/video conferencing for a telehealth visit today.  She has concerns about continued insomnia.  She thought this was related to the Vyvanse  however changed to Adderall  did not really make any difference.  She feels that this is to be more related to anxiety.  She would be open to adding an SSRI back on.  She has tried several over-the-counter medications without significant improvement.  She would admits to trying the trazodone  that was prescribed for her pet.  This did seem to help a little bit.    ROS:  A comprehensive ROS was completed and negative except as noted per HPI  Past Medical History:  Diagnosis Date   BV (bacterial vaginosis)    Hx of abnormal cervical Papanicolaou smear     No past surgical history on file.  Family History  Problem Relation Age of Onset   Hypertension Mother    Skin cancer Mother    Hypertension Maternal  Grandfather    Diabetes Maternal Grandfather    Stroke Maternal Grandfather    Skin cancer Maternal Grandfather    Prostate cancer Maternal Grandfather    Lung cancer Paternal Grandmother    Parkinson's disease Father 45    Social History   Socioeconomic History   Marital status: Significant Other    Spouse name: Not on file   Number of children: Not on file   Years of education: Not on file   Highest education level: Not on file  Occupational History   Occupation: Doctor, general practice  Tobacco Use   Smoking status: Never   Smokeless tobacco: Never  Vaping Use   Vaping status: Never Used  Substance and Sexual Activity   Alcohol use: Not on file   Drug use: Never   Sexual activity: Yes    Partners: Male    Birth control/protection: I.U.D.  Other Topics Concern   Not on file  Social History Narrative   Not on file   Social Drivers of Health   Financial Resource Strain: Not on file  Food Insecurity: Not on file  Transportation Needs: Not on file  Physical Activity: Not on file  Stress: Not on file  Social Connections: Not on file  Intimate Partner Violence: Not on file     Current Outpatient Medications:    cetirizine (ZYRTEC) 10 MG tablet, Take 10 mg by mouth daily., Disp: , Rfl:    Fluticasone Propionate (FLONASE ALLERGY RELIEF NA), Place into  the nose., Disp: , Rfl:    itraconazole  (SPORANOX ) 100 MG capsule, Take 200mg  (2 tabs) daily x 1 week.  Repeat every 2 weeks for 3 months, Disp: 84 capsule, Rfl: 0   ketotifen (ALAWAY) 0.025 % ophthalmic solution, 1 drop 2 (two) times daily., Disp: , Rfl:    ondansetron  (ZOFRAN -ODT) 4 MG disintegrating tablet, Take 1 tablet (4 mg total) by mouth every 8 (eight) hours as needed for nausea or vomiting., Disp: 20 tablet, Rfl: 2   tirzepatide  (ZEPBOUND ) 10 MG/0.5ML Pen, Inject 10 mg into the skin once a week., Disp: 2 mL, Rfl: 3   traZODone  (DESYREL ) 100 MG tablet, Take 1 tablet (100 mg total) by mouth at bedtime., Disp: 30  tablet, Rfl: 0   vortioxetine  HBr (TRINTELLIX ) 10 MG TABS tablet, Take 1 tablet (10 mg total) by mouth daily., Disp: 30 tablet, Rfl: 3   lisdexamfetamine (VYVANSE ) 10 MG capsule, Take 1 capsule (10 mg total) by mouth daily., Disp: 90 capsule, Rfl: 0  EXAM:  VITALS per patient if applicable: There were no vitals taken for this visit.  GENERAL: alert, oriented, appears well and in no acute distress  HEENT: atraumatic, conjunttiva clear, no obvious abnormalities on inspection of external nose and ears  NECK: normal movements of the head and neck  LUNGS: on inspection no signs of respiratory distress, breathing rate appears normal, no obvious gross SOB, gasping or wheezing  CV: no obvious cyanosis  MS: moves all visible extremities without noticeable abnormality  PSYCH/NEURO: pleasant and cooperative, no obvious depression or anxiety, speech and thought processing grossly intact  ASSESSMENT AND PLAN:  Discussed the following assessment and plan:  Attention deficit hyperactivity disorder (ADHD), predominantly inattentive type She feels the Vyvanse  worked better for her and does not seem to be the cause of her insomnia.  Will change Adderall  back to Vyvanse .  Insomnia Seems to be driven by anxiety.  Adding Trintellix  on as she had decreased libido with Lexapro .  Will add trazodone  on for a few weeks as well until Trintellix  is effective.     I discussed the assessment and treatment plan with the patient. The patient was provided an opportunity to ask questions and all were answered. The patient agreed with the plan and demonstrated an understanding of the instructions.   The patient was advised to call back or seek an in-person evaluation if the symptoms worsen or if the condition fails to improve as anticipated.    Adela Holter, DO

## 2023-03-06 NOTE — Assessment & Plan Note (Signed)
 She feels the Vyvanse  worked better for her and does not seem to be the cause of her insomnia.  Will change Adderall  back to Vyvanse .

## 2023-03-06 NOTE — Assessment & Plan Note (Signed)
 Seems to be driven by anxiety.  Adding Trintellix  on as she had decreased libido with Lexapro .  Will add trazodone  on for a few weeks as well until Trintellix  is effective.

## 2023-03-07 ENCOUNTER — Other Ambulatory Visit (HOSPITAL_COMMUNITY): Payer: Self-pay

## 2023-03-13 ENCOUNTER — Other Ambulatory Visit (HOSPITAL_COMMUNITY): Payer: Self-pay

## 2023-03-13 ENCOUNTER — Other Ambulatory Visit: Payer: Self-pay

## 2023-03-28 ENCOUNTER — Other Ambulatory Visit (HOSPITAL_COMMUNITY): Payer: Self-pay

## 2023-03-28 ENCOUNTER — Other Ambulatory Visit: Payer: Self-pay

## 2023-04-08 ENCOUNTER — Other Ambulatory Visit: Payer: Self-pay

## 2023-04-29 ENCOUNTER — Other Ambulatory Visit: Payer: Self-pay | Admitting: Family Medicine

## 2023-04-30 ENCOUNTER — Ambulatory Visit: Payer: 59 | Admitting: Family Medicine

## 2023-04-30 ENCOUNTER — Other Ambulatory Visit: Payer: Self-pay

## 2023-04-30 ENCOUNTER — Other Ambulatory Visit (HOSPITAL_COMMUNITY): Payer: Self-pay

## 2023-04-30 ENCOUNTER — Other Ambulatory Visit: Payer: Self-pay | Admitting: Family Medicine

## 2023-04-30 MED ORDER — ZEPBOUND 10 MG/0.5ML ~~LOC~~ SOAJ
10.0000 mg | SUBCUTANEOUS | 3 refills | Status: DC
  Filled 2023-04-30: qty 2, 28d supply, fill #0
  Filled 2023-05-28: qty 2, 28d supply, fill #1

## 2023-04-30 MED ORDER — LISDEXAMFETAMINE DIMESYLATE 10 MG PO CAPS
10.0000 mg | ORAL_CAPSULE | Freq: Every day | ORAL | 0 refills | Status: DC
  Filled 2023-04-30: qty 30, 30d supply, fill #0
  Filled 2023-07-05: qty 30, 30d supply, fill #1

## 2023-05-01 ENCOUNTER — Other Ambulatory Visit: Payer: Self-pay

## 2023-05-01 ENCOUNTER — Ambulatory Visit: Payer: 59 | Admitting: Family Medicine

## 2023-05-09 ENCOUNTER — Encounter: Payer: Self-pay | Admitting: Podiatry

## 2023-05-09 ENCOUNTER — Ambulatory Visit (INDEPENDENT_AMBULATORY_CARE_PROVIDER_SITE_OTHER): Admitting: Podiatry

## 2023-05-09 DIAGNOSIS — L600 Ingrowing nail: Secondary | ICD-10-CM

## 2023-05-09 DIAGNOSIS — B351 Tinea unguium: Secondary | ICD-10-CM | POA: Diagnosis not present

## 2023-05-09 NOTE — Progress Notes (Signed)
  Subjective:  Patient ID: Julie Brandt, female    DOB: [birth year],   MRN: [id number]  No chief complaint on file.   33 y.o. female presents for concern of right great toe ingrown nail and some changes on the left great toenail. Relates the toe doesn't cause too much pain but when she trimms occasioanlly will get bleeding and sometimes pus and wanted to have it taken care of. Currently for the left great toe she is on itraconazole pulse to help with the nail and fungus does seem to be improving.  Denies any other pedal complaints. Denies n/v/f/c.   Past Medical History:  Diagnosis Date   BV (bacterial vaginosis)    Hx of abnormal cervical Papanicolaou smear     Objective:  Physical Exam: Vascular: DP/PT pulses 2/4 bilateral. CFT <3 seconds. Normal hair growth on digits. No edema.  Skin. No lacerations or abrasions bilateral feet. Incurvation of lateral nail border with minimal tenderness to palpation. No erythema edema or purulence noted. Left hallux with distal whit discoloration about 1/4 of distal nail.  Musculoskeletal: MMT 5/5 bilateral lower extremities in DF, PF, Inversion and Eversion. Deceased ROM in DF of ankle joint.  Neurological: Sensation intact to light touch.   Assessment:   1. Ingrown right greater toenail   2. Onychomycosis      Plan:  Patient was evaluated and treated and all questions answered. Discussed ingrown toenails etiology and treatment options including procedure for removal vs conservative care.  Patient requesting removal of ingrown nail today. Procedure below.  Discussed procedure and post procedure care and patient expressed understanding.  Continue care with dermatology for fungal nail.  Will follow-up in 2 weeks for nail check or sooner if any problems arise.    Procedure:  Procedure: partial Nail Avulsion of right hallux lateral nail border.  Surgeon: Louann Sjogren, DPM  Pre-op Dx: Ingrown toenail without infection Post-op: Same  Place of  Surgery: Office exam room.  Indications for surgery: Painful and ingrown toenail.    The patient is requesting removal of nail with  chemical matrixectomy. Risks and complications were discussed with the patient for which they understand and written consent was obtained. Under sterile conditions a total of 3 mL of  1% lidocaine plain was infiltrated in a hallux block fashion. Once anesthetized, the skin was prepped in sterile fashion. A tourniquet was then applied. Next the lateral aspect of hallux nail border was then sharply excised making sure to remove the entire offending nail border.  Next phenol was then applied under standard conditions to permanently destroy the matrix and copiously irrigated. Silvadene was applied. A dry sterile dressing was applied. After application of the dressing the tourniquet was removed and there is found to be an immediate capillary refill time to the digit. The patient tolerated the procedure well without any complications. Post procedure instructions were discussed the patient for which he verbally understood. Follow-up in two weeks for nail check or sooner if any problems are to arise. Discussed signs/symptoms of infection and directed to call the office immediately should any occur or go directly to the emergency room. In the meantime, encouraged to call the office with any questions, concerns, changes symptoms.   Louann Sjogren, DPM

## 2023-05-09 NOTE — Patient Instructions (Signed)

## 2023-05-23 ENCOUNTER — Ambulatory Visit (INDEPENDENT_AMBULATORY_CARE_PROVIDER_SITE_OTHER): Admitting: Podiatry

## 2023-05-23 ENCOUNTER — Encounter: Payer: Self-pay | Admitting: Podiatry

## 2023-05-23 DIAGNOSIS — L600 Ingrowing nail: Secondary | ICD-10-CM

## 2023-05-23 DIAGNOSIS — B351 Tinea unguium: Secondary | ICD-10-CM

## 2023-05-23 NOTE — Progress Notes (Signed)
  Subjective:  Patient ID: Julie Brandt, female    DOB: 09-25-1990,   MRN: [id number]  Chief Complaint  Patient presents with   Nail Problem   Ingrown Toenail    Pt presents for a follow up of a  ingrown right great toenail states she is doing so much better. Denies any other pedal complaints.    33 y.o. female presents for follow-up as above. Doing well soaking as instructed . Denies any other pedal complaints. Denies n/v/f/c.   Past Medical History:  Diagnosis Date   BV (bacterial vaginosis)    Hx of abnormal cervical Papanicolaou smear     Objective:  Physical Exam: Vascular: DP/PT pulses 2/4 bilateral. CFT <3 seconds. Normal hair growth on digits. No edema.  Skin. No lacerations or abrasions bilateral feet. Right hallux nail healing well.  Musculoskeletal: MMT 5/5 bilateral lower extremities in DF, PF, Inversion and Eversion. Deceased ROM in DF of ankle joint.  Neurological: Sensation intact to light touch.   Assessment:   1. Ingrown right greater toenail   2. Onychomycosis      Plan:  Patient was evaluated and treated and all questions answered. Toe was evaluated and appears to be healing well.  May discontinue soaks and neosporin.  Patient to follow-up as needed.    Louann Sjogren, DPM

## 2023-05-28 ENCOUNTER — Other Ambulatory Visit: Payer: Self-pay

## 2023-05-28 ENCOUNTER — Other Ambulatory Visit (HOSPITAL_COMMUNITY): Payer: Self-pay

## 2023-05-28 ENCOUNTER — Encounter: Payer: Self-pay | Admitting: Pharmacist

## 2023-05-31 ENCOUNTER — Other Ambulatory Visit: Payer: Self-pay

## 2023-05-31 ENCOUNTER — Other Ambulatory Visit (HOSPITAL_COMMUNITY): Payer: Self-pay

## 2023-06-06 ENCOUNTER — Other Ambulatory Visit: Payer: Self-pay

## 2023-06-06 ENCOUNTER — Encounter: Payer: Self-pay | Admitting: Family Medicine

## 2023-06-06 ENCOUNTER — Other Ambulatory Visit (HOSPITAL_COMMUNITY): Payer: Self-pay

## 2023-06-06 ENCOUNTER — Ambulatory Visit (INDEPENDENT_AMBULATORY_CARE_PROVIDER_SITE_OTHER): Admitting: Family Medicine

## 2023-06-06 VITALS — BP 139/76 | HR 89 | Ht 62.0 in | Wt 214.9 lb

## 2023-06-06 DIAGNOSIS — F411 Generalized anxiety disorder: Secondary | ICD-10-CM

## 2023-06-06 DIAGNOSIS — F900 Attention-deficit hyperactivity disorder, predominantly inattentive type: Secondary | ICD-10-CM

## 2023-06-06 DIAGNOSIS — R222 Localized swelling, mass and lump, trunk: Secondary | ICD-10-CM | POA: Diagnosis not present

## 2023-06-06 MED ORDER — VORTIOXETINE HBR 10 MG PO TABS
10.0000 mg | ORAL_TABLET | Freq: Every day | ORAL | 3 refills | Status: DC
  Filled 2023-06-06 – 2023-07-05 (×3): qty 30, 30d supply, fill #0
  Filled 2023-08-05: qty 30, 30d supply, fill #1
  Filled 2023-09-01: qty 30, 30d supply, fill #2

## 2023-06-06 MED ORDER — ZEPBOUND 12.5 MG/0.5ML ~~LOC~~ SOAJ
12.5000 mg | SUBCUTANEOUS | 3 refills | Status: DC
  Filled 2023-06-06: qty 2, 28d supply, fill #0
  Filled 2023-07-05: qty 2, 28d supply, fill #1
  Filled 2023-08-05: qty 2, 28d supply, fill #2
  Filled 2023-09-01: qty 2, 28d supply, fill #3

## 2023-06-06 NOTE — Assessment & Plan Note (Signed)
 Doing well with Zepbound increase to 12.5mg /weekly.

## 2023-06-06 NOTE — Progress Notes (Signed)
 Julie Brandt - 33 y.o. female MRN [id number]  Date of birth: [birth year]  Subjective Chief Complaint  Patient presents with   Follow-up    HPI Julie Brandt is a 33 y.o. female here today for follow up visit.   She reports that she is doing well.  Trintellix is working really well for her.  She denies side effects related to this at current strength.    Doing well with zepbound.  Feels that she has reached a plateau at current strnegth.  Would like to move to next strength.   Still has nodule in upper abdomen.  This becomes irritated at times.  Denies increased GI symptoms.   ROS:  A comprehensive ROS was completed and negative except as noted per HPI  Allergies  Allergen Reactions   Cephalosporins Hives and Rash    No cross reactions with penicillins    Past Medical History:  Diagnosis Date   BV (bacterial vaginosis)    Hx of abnormal cervical Papanicolaou smear     History reviewed. No pertinent surgical history.  Social History   Socioeconomic History   Marital status: Significant Other    Spouse name: Not on file   Number of children: Not on file   Years of education: Not on file   Highest education level: Not on file  Occupational History   Occupation: Doctor, general practice  Tobacco Use   Smoking status: Never   Smokeless tobacco: Never  Vaping Use   Vaping status: Never Used  Substance and Sexual Activity   Alcohol use: Not on file   Drug use: Never   Sexual activity: Yes    Partners: Male    Birth control/protection: I.U.D.  Other Topics Concern   Not on file  Social History Narrative   Not on file   Social Drivers of Health   Financial Resource Strain: Not on file  Food Insecurity: Not on file  Transportation Needs: Not on file  Physical Activity: Not on file  Stress: Not on file  Social Connections: Not on file    Family History  Problem Relation Age of Onset   Hypertension Mother    Skin cancer Mother    Hypertension Maternal Grandfather     Diabetes Maternal Grandfather    Stroke Maternal Grandfather    Skin cancer Maternal Grandfather    Prostate cancer Maternal Grandfather    Lung cancer Paternal Grandmother    Parkinson's disease Father 74    Health Maintenance  Topic Date Due   HIV Screening  Never done   Hepatitis C Screening  Never done   INFLUENZA VACCINE  09/20/2023   DTaP/Tdap/Td (2 - Td or Tdap) 08/24/2027   Cervical Cancer Screening (HPV/Pap Cotest)  10/30/2027   HPV VACCINES  Completed   COVID-19 Vaccine  Completed   Meningococcal B Vaccine  Aged Out     ----------------------------------------------------------------------------------------------------------------------------------------------------------------------------------------------------------------- Physical Exam BP 139/76 (BP Location: Left Arm, Patient Position: Sitting, Cuff Size: Normal)   Pulse 89   Ht 5\' 2"  (1.575 m)   Wt 214 lb 14.4 oz (97.5 kg)   SpO2 99%   BMI 39.31 kg/m   Physical Exam Constitutional:      Appearance: Normal appearance.  HENT:     Head: Normocephalic and atraumatic.  Eyes:     General: No scleral icterus. Cardiovascular:     Rate and Rhythm: Normal rate and regular rhythm.  Pulmonary:     Effort: Pulmonary effort is normal.     Breath sounds: Normal breath  sounds.  Abdominal:     Comments: LUQ/Epigastric nodule. Non-tender.   Musculoskeletal:     Cervical back: Neck supple.  Neurological:     Mental Status: She is alert.  Psychiatric:        Mood and Affect: Mood normal.        Behavior: Behavior normal.     ------------------------------------------------------------------------------------------------------------------------------------------------------------------------------------------------------------------- Assessment and Plan  Attention deficit hyperactivity disorder (ADHD), predominantly inattentive type Doing well with vyvanse at current strength.   GAD (generalized anxiety  disorder) Trintellix is working well for her at current strength.  Will plan to continue.   Mass of anterior abdominal wall US  abdomen ordered.   Obesity, morbid, BMI 40.0-49.9 (HCC) Doing well with Zepbound increase to 12.5mg /weekly.     Meds ordered this encounter  Medications   vortioxetine HBr (TRINTELLIX) 10 MG TABS tablet    Sig: Take 1 tablet (10 mg total) by mouth daily.    Dispense:  30 tablet    Refill:  3   tirzepatide (ZEPBOUND) 12.5 MG/0.5ML Pen    Sig: Inject 12.5 mg into the skin once a week.    Dispense:  2 mL    Refill:  3    Return in about 6 months (around 12/06/2023) for ADHD.

## 2023-06-06 NOTE — Assessment & Plan Note (Signed)
 Doing well with vyvanse at current strength.

## 2023-06-06 NOTE — Assessment & Plan Note (Signed)
 Trintellix is working well for her at current strength.  Will plan to continue.

## 2023-06-06 NOTE — Assessment & Plan Note (Signed)
US abdomen ordered

## 2023-06-10 ENCOUNTER — Ambulatory Visit (INDEPENDENT_AMBULATORY_CARE_PROVIDER_SITE_OTHER)

## 2023-06-10 DIAGNOSIS — R222 Localized swelling, mass and lump, trunk: Secondary | ICD-10-CM | POA: Diagnosis not present

## 2023-06-14 ENCOUNTER — Encounter: Payer: Self-pay | Admitting: Family Medicine

## 2023-06-28 ENCOUNTER — Ambulatory Visit: Admitting: Podiatry

## 2023-07-03 ENCOUNTER — Telehealth: Admitting: Physician Assistant

## 2023-07-03 DIAGNOSIS — B001 Herpesviral vesicular dermatitis: Secondary | ICD-10-CM | POA: Diagnosis not present

## 2023-07-03 MED ORDER — VALACYCLOVIR HCL 1 G PO TABS: 2000.0000 mg | ORAL_TABLET | Freq: Two times a day (BID) | ORAL | 0 refills | Status: AC

## 2023-07-03 NOTE — Progress Notes (Signed)
 I have spent 5 minutes in review of e-visit questionnaire, review and updating patient chart, medical decision making and response to patient.   Piedad Climes, PA-C

## 2023-07-03 NOTE — Progress Notes (Signed)

## 2023-07-05 ENCOUNTER — Other Ambulatory Visit: Payer: Self-pay

## 2023-07-12 ENCOUNTER — Other Ambulatory Visit: Payer: Self-pay | Admitting: Sports Medicine

## 2023-07-12 ENCOUNTER — Other Ambulatory Visit (HOSPITAL_COMMUNITY): Payer: Self-pay

## 2023-07-14 ENCOUNTER — Other Ambulatory Visit (HOSPITAL_COMMUNITY): Payer: Self-pay

## 2023-07-14 MED ORDER — LISDEXAMFETAMINE DIMESYLATE 10 MG PO CAPS
10.0000 mg | ORAL_CAPSULE | Freq: Every day | ORAL | 0 refills | Status: DC
Start: 2023-07-14 — End: 2023-09-16
  Filled 2023-07-16 – 2023-07-30 (×2): qty 30, 30d supply, fill #0

## 2023-07-14 NOTE — Telephone Encounter (Signed)
 Patient only got a 30-day supply back in March, refilling, further refills Dr. Augustus Ledger.

## 2023-07-16 ENCOUNTER — Telehealth: Payer: Self-pay

## 2023-07-16 NOTE — Telephone Encounter (Signed)
 Pharmacy Patient Advocate Encounter   Received notification from CoverMyMeds that prior authorization for ZEPBOUND  is due for renewal.   Insurance verification completed.   The patient is insured through [facility].  Action: PA required; PA submitted to above mentioned insurance via CoverMyMeds Key/confirmation #/EOC [id number] Status is pending

## 2023-07-17 ENCOUNTER — Encounter: Payer: Self-pay | Admitting: Pharmacist

## 2023-07-17 ENCOUNTER — Other Ambulatory Visit: Payer: Self-pay

## 2023-07-17 ENCOUNTER — Other Ambulatory Visit (HOSPITAL_COMMUNITY): Payer: Self-pay

## 2023-07-17 NOTE — Telephone Encounter (Signed)
 Pharmacy Patient Advocate Encounter  Received notification from [facility] that Prior Authorization for ZEPBOUND  has been APPROVED from 07/17/23 to 07/16/24. Unable to obtain price due to refill too soon rejection, last fill date 07/05/23 next available fill date6/10/25   PA #/Case ID/Reference #: [id number]   *RENEWAL*

## 2023-07-30 ENCOUNTER — Other Ambulatory Visit: Payer: Self-pay

## 2023-07-30 ENCOUNTER — Other Ambulatory Visit (HOSPITAL_COMMUNITY): Payer: Self-pay

## 2023-08-01 ENCOUNTER — Encounter: Payer: Self-pay | Admitting: Podiatry

## 2023-08-01 ENCOUNTER — Ambulatory Visit: Admitting: Podiatry

## 2023-08-01 DIAGNOSIS — L600 Ingrowing nail: Secondary | ICD-10-CM

## 2023-08-01 DIAGNOSIS — B351 Tinea unguium: Secondary | ICD-10-CM

## 2023-08-01 NOTE — Progress Notes (Signed)
  Subjective:  Patient ID: Julie Brandt, female    DOB: 08/05/1990,   MRN: [id number]  No chief complaint on file.   33 y.o. female presents for follow-up of ingrown nail and nail lifting up on the inside. Concern for nail getting snagged or causing an ingrown toenail Denies any other pedal complaints. Denies n/v/f/c.   Past Medical History:  Diagnosis Date   BV (bacterial vaginosis)    Hx of abnormal cervical Papanicolaou smear     Objective:  Physical Exam: Vascular: DP/PT pulses 2/4 bilateral. CFT <3 seconds. Normal hair growth on digits. No edema.  Skin. No lacerations or abrasions bilateral feet. Right hallux nail healing well. Medial border with lifting of nail.  Musculoskeletal: MMT 5/5 bilateral lower extremities in DF, PF, Inversion and Eversion. Deceased ROM in DF of ankle joint.  Neurological: Sensation intact to light touch.   Assessment:   1. Onychomycosis   2. Ingrown left greater toenail       Plan:  Patient was evaluated and treated and all questions answered. Toe was evaluated and appears to be healing well.  Debrided back lift\ed part of nail to patient comfort.  May discontinue soaks and neosporin.  Patient to follow-up as needed.    Jennefer Moats, DPM

## 2023-08-05 ENCOUNTER — Other Ambulatory Visit (HOSPITAL_COMMUNITY): Payer: Self-pay

## 2023-08-05 ENCOUNTER — Other Ambulatory Visit: Payer: Self-pay

## 2023-08-05 ENCOUNTER — Other Ambulatory Visit: Payer: Self-pay | Admitting: Family Medicine

## 2023-08-05 MED ORDER — ITRACONAZOLE 100 MG PO CAPS
200.0000 mg | ORAL_CAPSULE | Freq: Every day | ORAL | 0 refills | Status: AC
Start: 2023-08-05 — End: ?
  Filled 2023-08-05: qty 28, 28d supply, fill #0

## 2023-08-09 ENCOUNTER — Other Ambulatory Visit: Payer: Self-pay

## 2023-08-10 ENCOUNTER — Other Ambulatory Visit (HOSPITAL_COMMUNITY): Payer: Self-pay

## 2023-08-12 ENCOUNTER — Other Ambulatory Visit: Payer: Self-pay

## 2023-09-02 ENCOUNTER — Other Ambulatory Visit: Payer: Self-pay

## 2023-09-02 ENCOUNTER — Other Ambulatory Visit (HOSPITAL_COMMUNITY): Payer: Self-pay

## 2023-09-16 ENCOUNTER — Encounter: Payer: Self-pay | Admitting: Family Medicine

## 2023-09-16 ENCOUNTER — Telehealth: Payer: Self-pay | Admitting: *Deleted

## 2023-09-16 ENCOUNTER — Ambulatory Visit (INDEPENDENT_AMBULATORY_CARE_PROVIDER_SITE_OTHER): Admitting: Family Medicine

## 2023-09-16 ENCOUNTER — Other Ambulatory Visit (HOSPITAL_COMMUNITY): Payer: Self-pay

## 2023-09-16 VITALS — BP 114/75 | HR 84 | Ht 62.0 in | Wt 213.0 lb

## 2023-09-16 DIAGNOSIS — F900 Attention-deficit hyperactivity disorder, predominantly inattentive type: Secondary | ICD-10-CM

## 2023-09-16 DIAGNOSIS — F411 Generalized anxiety disorder: Secondary | ICD-10-CM | POA: Diagnosis not present

## 2023-09-16 DIAGNOSIS — R222 Localized swelling, mass and lump, trunk: Secondary | ICD-10-CM | POA: Diagnosis not present

## 2023-09-16 DIAGNOSIS — H6090 Unspecified otitis externa, unspecified ear: Secondary | ICD-10-CM | POA: Insufficient documentation

## 2023-09-16 DIAGNOSIS — H60333 Swimmer's ear, bilateral: Secondary | ICD-10-CM

## 2023-09-16 MED ORDER — VORTIOXETINE HBR 10 MG PO TABS
10.0000 mg | ORAL_TABLET | Freq: Every day | ORAL | 1 refills | Status: AC
  Filled 2023-09-16 – 2023-09-18 (×2): qty 90, 90d supply, fill #0
  Filled 2023-09-23 – 2023-09-26 (×2): qty 30, 30d supply, fill #0
  Filled 2023-11-03: qty 30, 30d supply, fill #1
  Filled 2023-12-04: qty 30, 30d supply, fill #2
  Filled 2024-01-01: qty 30, 30d supply, fill #3
  Filled 2024-01-28: qty 30, 30d supply, fill #4
  Filled 2024-02-28 – 2024-03-02 (×2): qty 30, 30d supply, fill #5

## 2023-09-16 MED ORDER — NEOMYCIN-POLYMYXIN-HC 1 % OT SOLN
3.0000 [drp] | Freq: Four times a day (QID) | OTIC | 0 refills | Status: AC
  Filled 2023-09-16 – 2023-09-23 (×3): qty 10, 17d supply, fill #0

## 2023-09-16 MED ORDER — LISDEXAMFETAMINE DIMESYLATE 10 MG PO CAPS: 10.0000 mg | ORAL_CAPSULE | Freq: Every day | ORAL | 0 refills | Status: DC

## 2023-09-16 MED ORDER — VORTIOXETINE HBR 10 MG PO TABS: 10.0000 mg | ORAL_TABLET | Freq: Every day | ORAL | 1 refills | Status: DC

## 2023-09-16 MED ORDER — LISDEXAMFETAMINE DIMESYLATE 10 MG PO CAPS
10.0000 mg | ORAL_CAPSULE | Freq: Every day | ORAL | 0 refills | Status: DC
  Filled 2023-09-16 – 2023-09-30 (×5): qty 30, 30d supply, fill #0

## 2023-09-16 MED ORDER — NEOMYCIN-POLYMYXIN-HC 1 % OT SOLN: 3.0000 [drp] | Freq: Four times a day (QID) | OTIC | 0 refills | Status: DC

## 2023-09-16 NOTE — Assessment & Plan Note (Signed)
 Doing well with vyvanse at current strength.

## 2023-09-16 NOTE — Telephone Encounter (Signed)
 Called and spoke with pt about meds. Pt stated that meds were supposed to have been switched from [facility] to [facility]. I have sent the cortisporin  to [facility]. Routing back to Dr. Alvia so we can get the trintellix  and vyvanse  sent to [facility]. Have made that pt's primary pharmacy.

## 2023-09-16 NOTE — Telephone Encounter (Signed)
 Please see mychart message sent by pt and advise.

## 2023-09-16 NOTE — Telephone Encounter (Signed)
 Copied from CRM [id number]. Topic: Clinical - Prescription Issue >> Sep 16, 2023  2:42 PM Kevelyn M wrote: Reason for CRM: Need to know how long patient is to utilize this prescription NEOMYCIN -POLYMYXIN-HYDROCORTISONE (CORTISPORIN ) 1 % SOLN OTIC solution   Call back # Delon [phone]

## 2023-09-16 NOTE — Assessment & Plan Note (Signed)
 She is doing well with Zepbound .  Will plan to continue at current strength.

## 2023-09-16 NOTE — Assessment & Plan Note (Signed)
 Trintellix is working well for her at current strength.  Will plan to continue.

## 2023-09-16 NOTE — Assessment & Plan Note (Signed)
 Update US  of abdomen.

## 2023-09-16 NOTE — Assessment & Plan Note (Signed)
 Treating with course of cortisporin  drops.

## 2023-09-16 NOTE — Progress Notes (Signed)
 Julie Brandt - 33 y.o. female MRN [id number]  Date of birth: 1990/02/23  Subjective Chief Complaint  Patient presents with   Ear Fullness   Mass    HPI Julie Brandt is a 33 y.o. female here today for follow up.  She reports that Vyvanse  at current strength remains effective for her.    Zepbound  is working well.  Weight today is 213lbs. Overall she is tolerating this pretty well.  Denies worsening nausea or vomiting   She has had abdominal wall mass.  She feels that this has gotten a little larger.  Denies abdominal pain, nausea or vomiting.  She notes that her ears have some pain.  Pain seems be in the canals.  Swimming a few weeks ago.  Denies drainage, fever or chills. .   ROS:  A comprehensive ROS was completed and negative except as noted per HPI  Allergies  Allergen Reactions   Cephalosporins Hives and Rash    No cross reactions with penicillins    Past Medical History:  Diagnosis Date   Allergy 2018   Seasonal and aggravated by excess pet hair/dander   BV (bacterial vaginosis)    Hx of abnormal cervical Papanicolaou smear     History reviewed. No pertinent surgical history.  Social History   Socioeconomic History   Marital status: Significant Other    Spouse name: Not on file   Number of children: Not on file   Years of education: Not on file   Highest education level: Not on file  Occupational History   Occupation: Doctor, general practice  Tobacco Use   Smoking status: Never   Smokeless tobacco: Never  Vaping Use   Vaping status: Never Used  Substance and Sexual Activity   Alcohol use: Not on file   Drug use: Never   Sexual activity: Yes    Partners: Male    Birth control/protection: I.U.D.  Other Topics Concern   Not on file  Social History Narrative   Not on file   Social Drivers of Health   Financial Resource Strain: Low Risk  (09/16/2023)   Overall Financial Resource Strain (CARDIA)    Difficulty of Paying Living Expenses: Not hard at all  Food  Insecurity: No Food Insecurity (09/16/2023)   Hunger Vital Sign    Worried About Running Out of Food in the Last Year: Never true    Ran Out of Food in the Last Year: Never true  Transportation Needs: Unknown (09/16/2023)   PRAPARE - Administrator, Civil Service (Medical): No    Lack of Transportation (Non-Medical): Not on file  Physical Activity: Insufficiently Active (09/16/2023)   Exercise Vital Sign    Days of Exercise per Week: 3 days    Minutes of Exercise per Session: 30 min  Stress: No Stress Concern Present (09/16/2023)   [employer] of Occupational Health - Occupational Stress Questionnaire    Feeling of Stress: Only a little  Social Connections: Socially Integrated (09/16/2023)   Social Connection and Isolation Panel    Frequency of Communication with Friends and Family: More than three times a week    Frequency of Social Gatherings with Friends and Family: Once a week    Attends Religious Services: 1 to 4 times per year    Active Member of [employer] or Organizations: Yes    Attends Banker Meetings: 1 to 4 times per year    Marital Status: Married    Family History  Problem Relation Age of Onset  Hypertension Mother    Skin cancer Mother    ADD / ADHD Mother    Depression Mother    Obesity Mother    Hypertension Maternal Grandfather    Diabetes Maternal Grandfather    Stroke Maternal Grandfather    Skin cancer Maternal Grandfather    Prostate cancer Maternal Grandfather    Cancer Maternal Grandfather    Heart disease Maternal Grandfather    Obesity Maternal Grandfather    Lung cancer Paternal Grandmother    Parkinson's disease Father 4   Depression Father    Obesity Father    Arthritis Maternal Grandmother    Diabetes Maternal Grandmother    Hyperlipidemia Maternal Grandmother    Hypertension Maternal Grandmother    Obesity Maternal Grandmother    ADD / ADHD Brother    Learning disabilities Brother     Health Maintenance  Topic  Date Due   HIV Screening  Never done   Hepatitis C Screening  Never done   Hepatitis B Vaccines (1 of 3 - 19+ 3-dose series) Never done   INFLUENZA VACCINE  09/20/2023   DTaP/Tdap/Td (2 - Td or Tdap) 08/24/2027   Cervical Cancer Screening (HPV/Pap Cotest)  10/30/2027   HPV VACCINES  Completed   COVID-19 Vaccine  Completed   Meningococcal B Vaccine  Aged Out     ----------------------------------------------------------------------------------------------------------------------------------------------------------------------------------------------------------------- Physical Exam BP 114/75 (BP Location: Left Arm, Patient Position: Sitting, Cuff Size: Large)   Pulse 84   Ht 5' 2 (1.575 m)   Wt 213 lb (96.6 kg)   SpO2 97%   BMI 38.96 kg/m   Physical Exam Constitutional:      Appearance: Normal appearance.  HENT:     Head: Normocephalic and atraumatic.     Ears:     Comments: Mild swelling and erythema of R EAC.  TM normal.  L EAC with mild erythema.  TM normal.  Eyes:     General: No scleral icterus. Cardiovascular:     Rate and Rhythm: Normal rate and regular rhythm.  Pulmonary:     Effort: Pulmonary effort is normal.     Breath sounds: Normal breath sounds.  Musculoskeletal:     Cervical back: Neck supple.  Neurological:     Mental Status: She is alert.  Psychiatric:        Mood and Affect: Mood normal.        Behavior: Behavior normal.     ------------------------------------------------------------------------------------------------------------------------------------------------------------------------------------------------------------------- Assessment and Plan  Mass of anterior abdominal wall Update US  of abdomen.   Attention deficit hyperactivity disorder (ADHD), predominantly inattentive type Doing well with vyvanse  at current strength.   GAD (generalized anxiety disorder) Trintellix  is working well for her at current strength.  Will plan to  continue.   Obesity, morbid, BMI 40.0-49.9 (HCC) She is doing well with Zepbound .  Will plan to continue at current strength.   Otitis externa Treating with course of cortisporin  drops.    Meds ordered this encounter  Medications   lisdexamfetamine (VYVANSE ) 10 MG capsule    Sig: Take 1 capsule (10 mg total) by mouth daily.    Dispense:  30 capsule    Refill:  0   vortioxetine  HBr (TRINTELLIX ) 10 MG TABS tablet    Sig: Take 1 tablet (10 mg total) by mouth daily.    Dispense:  90 tablet    Refill:  1   NEOMYCIN -POLYMYXIN-HYDROCORTISONE (CORTISPORIN ) 1 % SOLN OTIC solution    Sig: Place 3 drops into both ears every 6 (six) hours.  Dispense:  10 mL    Refill:  0    No follow-ups on file.

## 2023-09-18 ENCOUNTER — Other Ambulatory Visit (HOSPITAL_COMMUNITY): Payer: Self-pay

## 2023-09-20 ENCOUNTER — Ambulatory Visit

## 2023-09-20 DIAGNOSIS — R222 Localized swelling, mass and lump, trunk: Secondary | ICD-10-CM | POA: Diagnosis not present

## 2023-09-23 ENCOUNTER — Other Ambulatory Visit (HOSPITAL_COMMUNITY): Payer: Self-pay

## 2023-09-24 ENCOUNTER — Other Ambulatory Visit: Payer: Self-pay

## 2023-09-26 ENCOUNTER — Other Ambulatory Visit (HOSPITAL_COMMUNITY): Payer: Self-pay

## 2023-09-30 ENCOUNTER — Other Ambulatory Visit: Payer: Self-pay

## 2023-09-30 ENCOUNTER — Other Ambulatory Visit (HOSPITAL_COMMUNITY): Payer: Self-pay

## 2023-10-04 ENCOUNTER — Ambulatory Visit: Payer: Self-pay | Admitting: Family Medicine

## 2023-10-14 ENCOUNTER — Other Ambulatory Visit: Payer: Self-pay | Admitting: Family Medicine

## 2023-10-14 ENCOUNTER — Other Ambulatory Visit (HOSPITAL_COMMUNITY): Payer: Self-pay

## 2023-10-14 ENCOUNTER — Other Ambulatory Visit: Payer: Self-pay

## 2023-10-14 MED ORDER — ZEPBOUND 12.5 MG/0.5ML ~~LOC~~ SOAJ
12.5000 mg | SUBCUTANEOUS | 3 refills | Status: DC
  Filled 2023-10-14: qty 2, 28d supply, fill #0
  Filled 2023-11-12: qty 2, 28d supply, fill #1
  Filled 2023-12-04: qty 2, 28d supply, fill #2
  Filled 2024-01-01: qty 2, 28d supply, fill #3

## 2023-10-22 ENCOUNTER — Encounter: Payer: Self-pay | Admitting: Sports Medicine

## 2023-11-03 ENCOUNTER — Other Ambulatory Visit: Payer: Self-pay | Admitting: Family Medicine

## 2023-11-04 ENCOUNTER — Other Ambulatory Visit: Payer: Self-pay

## 2023-11-04 ENCOUNTER — Other Ambulatory Visit (HOSPITAL_COMMUNITY): Payer: Self-pay

## 2023-11-06 ENCOUNTER — Other Ambulatory Visit (HOSPITAL_COMMUNITY): Payer: Self-pay

## 2023-11-08 ENCOUNTER — Other Ambulatory Visit (HOSPITAL_COMMUNITY): Payer: Self-pay

## 2023-11-11 MED ORDER — LISDEXAMFETAMINE DIMESYLATE 10 MG PO CAPS
10.0000 mg | ORAL_CAPSULE | Freq: Every day | ORAL | 0 refills | Status: DC
  Filled 2023-11-11: qty 30, 30d supply, fill #0

## 2023-11-12 ENCOUNTER — Other Ambulatory Visit (HOSPITAL_COMMUNITY): Payer: Self-pay

## 2023-11-12 ENCOUNTER — Other Ambulatory Visit: Payer: Self-pay

## 2023-12-04 ENCOUNTER — Other Ambulatory Visit (HOSPITAL_COMMUNITY): Payer: Self-pay

## 2023-12-18 ENCOUNTER — Telehealth (HOSPITAL_COMMUNITY): Payer: Self-pay

## 2023-12-18 ENCOUNTER — Telehealth: Admitting: Family Medicine

## 2023-12-18 ENCOUNTER — Encounter: Payer: Self-pay | Admitting: Family Medicine

## 2023-12-18 ENCOUNTER — Other Ambulatory Visit (HOSPITAL_COMMUNITY): Payer: Self-pay

## 2023-12-18 ENCOUNTER — Other Ambulatory Visit: Payer: Self-pay

## 2023-12-18 VITALS — HR 96 | Ht 62.0 in | Wt 201.0 lb

## 2023-12-18 DIAGNOSIS — F900 Attention-deficit hyperactivity disorder, predominantly inattentive type: Secondary | ICD-10-CM

## 2023-12-18 DIAGNOSIS — Z8619 Personal history of other infectious and parasitic diseases: Secondary | ICD-10-CM | POA: Diagnosis not present

## 2023-12-18 MED ORDER — AMPHETAMINE-DEXTROAMPHETAMINE 10 MG PO TABS
10.0000 mg | ORAL_TABLET | Freq: Every day | ORAL | 0 refills | Status: AC | PRN
  Filled 2023-12-18: qty 30, 30d supply, fill #0

## 2023-12-18 MED ORDER — VALACYCLOVIR HCL 500 MG PO TABS
500.0000 mg | ORAL_TABLET | Freq: Every day | ORAL | 0 refills | Status: AC
  Filled 2023-12-18: qty 30, 30d supply, fill #0

## 2023-12-18 NOTE — Telephone Encounter (Signed)
 Pharmacy Patient Advocate Encounter   Received notification from Patient Pharmacy that prior authorization for Amphetamine -Dextroamphetamine  10 mg tablets is required/requested.   Insurance verification completed.   The patient is insured through [facility].   Per test claim: PA required; PA submitted to above mentioned insurance via Latent Key/confirmation #/EOC [facility] Status is pending

## 2023-12-18 NOTE — Progress Notes (Deleted)
 Julie Brandt - 33 y.o. female MRN [id number]  Date of birth: August 31, 1990  Subjective Chief Complaint  Patient presents with  . Mouth Lesions    HPI  Allergies  Allergen Reactions  . Cephalosporins Hives and Rash    No cross reactions with penicillins    Past Medical History:  Diagnosis Date  . Allergy 2018   Seasonal and aggravated by excess pet hair/dander  . BV (bacterial vaginosis)   . Hx of abnormal cervical Papanicolaou smear     History reviewed. No pertinent surgical history.  Social History   Socioeconomic History  . Marital status: Significant Other    Spouse name: Not on file  . Number of children: Not on file  . Years of education: Not on file  . Highest education level: Not on file  Occupational History  . Occupation: Doctor, General Practice  Tobacco Use  . Smoking status: Never  . Smokeless tobacco: Never  Vaping Use  . Vaping status: Never Used  Substance and Sexual Activity  . Alcohol use: Not on file  . Drug use: Never  . Sexual activity: Yes    Partners: Male    Birth control/protection: I.U.D.  Other Topics Concern  . Not on file  Social History Narrative  . Not on file   Social Drivers of Health   Financial Resource Strain: Low Risk  (09/16/2023)   Overall Financial Resource Strain (CARDIA)   . Difficulty of Paying Living Expenses: Not hard at all  Food Insecurity: No Food Insecurity (09/16/2023)   Hunger Vital Sign   . Worried About Programme Researcher, Broadcasting/film/video in the Last Year: Never true   . Ran Out of Food in the Last Year: Never true  Transportation Needs: Unknown (09/16/2023)   PRAPARE - Transportation   . Lack of Transportation (Medical): No   . Lack of Transportation (Non-Medical): Not on file  Physical Activity: Insufficiently Active (09/16/2023)   Exercise Vital Sign   . Days of Exercise per Week: 3 days   . Minutes of Exercise per Session: 30 min  Stress: No Stress Concern Present (09/16/2023)   [employer] of Occupational Health  - Occupational Stress Questionnaire   . Feeling of Stress: Only a little  Social Connections: Socially Integrated (09/16/2023)   Social Connection and Isolation Panel   . Frequency of Communication with Friends and Family: More than three times a week   . Frequency of Social Gatherings with Friends and Family: Once a week   . Attends Religious Services: 1 to 4 times per year   . Active Member of Clubs or Organizations: Yes   . Attends Banker Meetings: 1 to 4 times per year   . Marital Status: Married    Family History  Problem Relation Age of Onset  . Hypertension Mother   . Skin cancer Mother   . ADD / ADHD Mother   . Depression Mother   . Obesity Mother   . Hypertension Maternal Grandfather   . Diabetes Maternal Grandfather   . Stroke Maternal Grandfather   . Skin cancer Maternal Grandfather   . Prostate cancer Maternal Grandfather   . Cancer Maternal Grandfather   . Heart disease Maternal Grandfather   . Obesity Maternal Grandfather   . Lung cancer Paternal Grandmother   . Parkinson's disease Father 35  . Depression Father   . Obesity Father   . Arthritis Maternal Grandmother   . Diabetes Maternal Grandmother   . Hyperlipidemia Maternal Grandmother   . Hypertension  Maternal Grandmother   . Obesity Maternal Grandmother   . ADD / ADHD Brother   . Learning disabilities Brother     Health Maintenance  Topic Date Due  . HIV Screening  Never done  . Hepatitis C Screening  Never done  . Influenza Vaccine  05/19/2024 (Originally 09/20/2023)  . Hepatitis B Vaccines 19-59 Average Risk (1 of 3 - 19+ 3-dose series) 12/17/2024 (Originally 02/10/2008)  . COVID-19 Vaccine (4 - 2025-26 season) 01/02/2025 (Originally 10/21/2023)  . DTaP/Tdap/Td (2 - Td or Tdap) 08/24/2027  . Cervical Cancer Screening (HPV/Pap Cotest)  10/30/2027  . HPV VACCINES  Completed  . Pneumococcal Vaccine  Aged Out  . Meningococcal B Vaccine  Aged Out      ----------------------------------------------------------------------------------------------------------------------------------------------------------------------------------------------------------------- Physical Exam Pulse 96   Ht 5' 2 (1.575 m)   Wt 201 lb (91.2 kg)   SpO2 99%   BMI 36.76 kg/m   Physical Exam  ------------------------------------------------------------------------------------------------------------------------------------------------------------------------------------------------------------------- Assessment and Plan  No problem-specific Assessment & Plan notes found for this encounter.   No orders of the defined types were placed in this encounter.   No follow-ups on file.

## 2023-12-18 NOTE — Assessment & Plan Note (Signed)
 And Valtrex  500 mg daily for now.  She may switch this to using this as needed after her wedding.

## 2023-12-18 NOTE — Assessment & Plan Note (Signed)
 Continues on Vyvanse .  Additional 10 mg Adderall  as needed in the afternoon morning.

## 2023-12-18 NOTE — Progress Notes (Signed)
 Julie Brandt - 33 y.o. female MRN [id number]  Date of birth: 08-16-1990   All issues noted in this document were discussed and addressed.  No physical exam was performed (except for noted visual exam findings with Video Visits).  I discussed the limitations of evaluation and management by telemedicine and the availability of in person appointments. The patient expressed understanding and agreed to proceed.  I connected withNAME@ on 12/18/23 at 10:30 AM EDT by a video enabled telemedicine application and verified that I am speaking with the correct person using two identifiers.  Present at visit: Velma Ku, DO Garnette Borrelli   Patient Location: Home [address] [place] [address]   Provider location:   [facility]  Chief Complaint  Patient presents with   Mouth Lesions    HPI  Julie Brandt is a 33 y.o. female who presents via audio/video conferencing for a telehealth visit today.  She has history of cold sores.  Typically only has outbreaks 2-3 times per year.  Her wedding is upcoming she would like to have medication to help with suppression so that she does not have a flare prior to her wedding day.    She also needs renewal on her Adderall  10 mg that she uses in the afternoon as needed.  She does not use this too often but when working later shifts it is good for the allergies.  She does tend to need this.  ROS:  A comprehensive ROS was completed and negative except as noted per HPI    ROS:  A comprehensive ROS was completed and negative except as noted per HPI  Past Medical History:  Diagnosis Date   Allergy 2018   Seasonal and aggravated by excess pet hair/dander   BV (bacterial vaginosis)    Hx of abnormal cervical Papanicolaou smear     History reviewed. No pertinent surgical history.  Family History  Problem Relation Age of Onset   Hypertension Mother    Skin cancer Mother    ADD / ADHD Mother    Depression Mother    Obesity Mother    Hypertension  Maternal Grandfather    Diabetes Maternal Grandfather    Stroke Maternal Grandfather    Skin cancer Maternal Grandfather    Prostate cancer Maternal Grandfather    Cancer Maternal Grandfather    Heart disease Maternal Grandfather    Obesity Maternal Grandfather    Lung cancer Paternal Grandmother    Parkinson's disease Father 80   Depression Father    Obesity Father    Arthritis Maternal Grandmother    Diabetes Maternal Grandmother    Hyperlipidemia Maternal Grandmother    Hypertension Maternal Grandmother    Obesity Maternal Grandmother    ADD / ADHD Brother    Learning disabilities Brother     Social History   Socioeconomic History   Marital status: Significant Other    Spouse name: Not on file   Number of children: Not on file   Years of education: Not on file   Highest education level: Not on file  Occupational History   Occupation: Doctor, General Practice  Tobacco Use   Smoking status: Never   Smokeless tobacco: Never  Vaping Use   Vaping status: Never Used  Substance and Sexual Activity   Alcohol use: Not on file   Drug use: Never   Sexual activity: Yes    Partners: Male    Birth control/protection: I.U.D.  Other Topics Concern   Not on file  Social History  Narrative   Not on file   Social Drivers of Health   Financial Resource Strain: Low Risk  (09/16/2023)   Overall Financial Resource Strain (CARDIA)    Difficulty of Paying Living Expenses: Not hard at all  Food Insecurity: No Food Insecurity (09/16/2023)   Hunger Vital Sign    Worried About Running Out of Food in the Last Year: Never true    Ran Out of Food in the Last Year: Never true  Transportation Needs: Unknown (09/16/2023)   PRAPARE - Administrator, Civil Service (Medical): No    Lack of Transportation (Non-Medical): Not on file  Physical Activity: Insufficiently Active (09/16/2023)   Exercise Vital Sign    Days of Exercise per Week: 3 days    Minutes of Exercise per Session: 30 min   Stress: No Stress Concern Present (09/16/2023)   [employer] of Occupational Health - Occupational Stress Questionnaire    Feeling of Stress: Only a little  Social Connections: Socially Integrated (09/16/2023)   Social Connection and Isolation Panel    Frequency of Communication with Friends and Family: More than three times a week    Frequency of Social Gatherings with Friends and Family: Once a week    Attends Religious Services: 1 to 4 times per year    Active Member of [employer] or Organizations: Yes    Attends Banker Meetings: 1 to 4 times per year    Marital Status: Married  Catering Manager Violence: Not At Risk (09/16/2023)   Humiliation, Afraid, Rape, and Kick questionnaire    Fear of Current or Ex-Partner: No    Emotionally Abused: No    Physically Abused: No    Sexually Abused: No     Current Outpatient Medications:    Fluticasone Propionate (FLONASE ALLERGY RELIEF NA), Place into the nose., Disp: , Rfl:    ketotifen (ALAWAY) 0.025 % ophthalmic solution, 1 drop 2 (two) times daily., Disp: , Rfl:    lisdexamfetamine (VYVANSE ) 10 MG capsule, Take 1 capsule (10 mg total) by mouth daily., Disp: 30 capsule, Rfl: 0   loratadine (CLARITIN) 10 MG tablet, Take 10 mg by mouth daily., Disp: , Rfl:    NEOMYCIN -POLYMYXIN-HYDROCORTISONE (CORTISPORIN ) 1 % SOLN OTIC solution, Place 3 drops into both ears every 6 (six) hours. (Patient taking differently: Place 3 drops into both ears daily as needed.), Disp: 10 mL, Rfl: 0   ondansetron  (ZOFRAN -ODT) 4 MG disintegrating tablet, Take 1 tablet (4 mg total) by mouth every 8 (eight) hours as needed for nausea or vomiting., Disp: 20 tablet, Rfl: 2   tirzepatide  (ZEPBOUND ) 12.5 MG/0.5ML Pen, Inject 12.5 mg into the skin once a week., Disp: 2 mL, Rfl: 3   traZODone  (DESYREL ) 100 MG tablet, Take 1 tablet (100 mg total) by mouth at bedtime. (Patient taking differently: Take 100 mg by mouth at bedtime as needed.), Disp: 30 tablet, Rfl: 0    valACYclovir  (VALTREX ) 500 MG tablet, Take 1 tablet (500 mg total) by mouth daily., Disp: 90 tablet, Rfl: 0   vortioxetine  HBr (TRINTELLIX ) 10 MG TABS tablet, Take 1 tablet (10 mg total) by mouth daily., Disp: 90 tablet, Rfl: 1   amphetamine -dextroamphetamine  (ADDERALL ) 10 MG tablet, Take 1 tablet (10 mg total) by mouth daily as needed., Disp: 30 tablet, Rfl: 0   itraconazole  (SPORANOX ) 100 MG capsule, Take 2 capsules (200 mg total) by mouth daily for 1 week.  Repeat every 2 weeks for 3 months. (Patient not taking: Reported on 12/18/2023), Disp: 84  capsule, Rfl: 0  EXAM:  VITALS per patient if applicable: Pulse 96   Ht 5' 2 (1.575 m)   Wt 201 lb (91.2 kg)   SpO2 99%   BMI 36.76 kg/m   GENERAL: alert, oriented, appears well and in no acute distress  HEENT: atraumatic, conjunttiva clear, no obvious abnormalities on inspection of external nose and ears  NECK: normal movements of the head and neck  LUNGS: on inspection no signs of respiratory distress, breathing rate appears normal, no obvious gross SOB, gasping or wheezing  CV: no obvious cyanosis  MS: moves all visible extremities without noticeable abnormality  PSYCH/NEURO: pleasant and cooperative, no obvious depression or anxiety, speech and thought processing grossly intact  ASSESSMENT AND PLAN:  Discussed the following assessment and plan:  Attention deficit hyperactivity disorder (ADHD), predominantly inattentive type Continues on Vyvanse .  Additional 10 mg Adderall  as needed in the afternoon morning.  History of cold sores And Valtrex  500 mg daily for now.  She may switch this to using this as needed after her wedding.     I discussed the assessment and treatment plan with the patient. The patient was provided an opportunity to ask questions and all were answered. The patient agreed with the plan and demonstrated an understanding of the instructions.   The patient was advised to call back or seek an in-person  evaluation if the symptoms worsen or if the condition fails to improve as anticipated.    Velma Ku, DO

## 2023-12-19 ENCOUNTER — Other Ambulatory Visit (HOSPITAL_COMMUNITY): Payer: Self-pay

## 2023-12-19 NOTE — Telephone Encounter (Signed)
 Pharmacy Patient Advocate Encounter  Received notification from [facility] that Prior Authorization for  Amphetamine -Dextroamphetamine  10 mg tablets  has been APPROVED from 12/18/23 to 12/18/26. Ran test claim, Copay is $0.34. This test claim was processed through [facility]- copay amounts may vary at other pharmacies due to pharmacy/plan contracts, or as the patient moves through the different stages of their insurance plan.   PA #/Case ID/Reference #: [id number]

## 2024-01-01 ENCOUNTER — Other Ambulatory Visit (HOSPITAL_COMMUNITY): Payer: Self-pay

## 2024-01-02 ENCOUNTER — Other Ambulatory Visit: Payer: Self-pay

## 2024-01-28 ENCOUNTER — Other Ambulatory Visit: Payer: Self-pay | Admitting: Family Medicine

## 2024-01-28 ENCOUNTER — Other Ambulatory Visit (HOSPITAL_COMMUNITY): Payer: Self-pay

## 2024-01-28 ENCOUNTER — Other Ambulatory Visit: Payer: Self-pay

## 2024-01-28 DIAGNOSIS — F900 Attention-deficit hyperactivity disorder, predominantly inattentive type: Secondary | ICD-10-CM

## 2024-01-28 DIAGNOSIS — R635 Abnormal weight gain: Secondary | ICD-10-CM

## 2024-01-28 MED ORDER — ZEPBOUND 12.5 MG/0.5ML ~~LOC~~ SOAJ
12.5000 mg | SUBCUTANEOUS | 3 refills | Status: AC
  Filled 2024-01-28: qty 2, 28d supply, fill #0
  Filled 2024-02-28 – 2024-03-02 (×2): qty 2, 28d supply, fill #1

## 2024-01-28 MED ORDER — LISDEXAMFETAMINE DIMESYLATE 10 MG PO CAPS
10.0000 mg | ORAL_CAPSULE | Freq: Every day | ORAL | 0 refills | Status: DC
  Filled 2024-01-28: qty 30, 30d supply, fill #0

## 2024-02-03 ENCOUNTER — Other Ambulatory Visit: Payer: Self-pay

## 2024-02-03 ENCOUNTER — Encounter (HOSPITAL_COMMUNITY): Payer: Self-pay | Admitting: Emergency Medicine

## 2024-02-03 ENCOUNTER — Emergency Department (HOSPITAL_COMMUNITY)
Admission: EM | Admit: 2024-02-03 | Discharge: 2024-02-03 | Discharge status: Against Medical Advice | Attending: Emergency Medicine | Admitting: Emergency Medicine

## 2024-02-03 DIAGNOSIS — Z5321 Procedure and treatment not carried out due to patient leaving prior to being seen by health care provider: Secondary | ICD-10-CM | POA: Diagnosis not present

## 2024-02-03 DIAGNOSIS — R1011 Right upper quadrant pain: Secondary | ICD-10-CM | POA: Diagnosis present

## 2024-02-03 LAB — CBC
HCT: 37.6 % (ref 36.0–46.0)
Hemoglobin: 12.9 g/dL (ref 12.0–15.0)
MCH: 30.1 pg (ref 26.0–34.0)
MCHC: 34.3 g/dL (ref 30.0–36.0)
MCV: 87.6 fL (ref 80.0–100.0)
Platelets: 251 K/uL (ref 150–400)
RBC: 4.29 MIL/uL (ref 3.87–5.11)
RDW: 12.3 % (ref 11.5–15.5)
WBC: 6.1 K/uL (ref 4.0–10.5)
nRBC: 0 % (ref 0.0–0.2)

## 2024-02-03 LAB — COMPREHENSIVE METABOLIC PANEL WITH GFR
ALT: 19 U/L (ref 0–44)
AST: 17 U/L (ref 15–41)
Albumin: 3.6 g/dL (ref 3.5–5.0)
Alkaline Phosphatase: 73 U/L (ref 38–126)
Anion gap: 5 (ref 5–15)
BUN: 12 mg/dL (ref 6–20)
CO2: 25 mmol/L (ref 22–32)
Calcium: 8.6 mg/dL — ABNORMAL LOW (ref 8.9–10.3)
Chloride: 107 mmol/L (ref 98–111)
Creatinine, Ser: 0.88 mg/dL (ref 0.44–1.00)
GFR, Estimated: >60 mL/min (ref >60)
Glucose, Bld: 86 mg/dL (ref 70–99)
Potassium: 4 mmol/L (ref 3.5–5.1)
Sodium: 137 mmol/L (ref 135–145)
Total Bilirubin: 0.7 mg/dL (ref 0.0–1.2)
Total Protein: 6.5 g/dL (ref 6.5–8.1)

## 2024-02-03 LAB — URINALYSIS, ROUTINE W REFLEX MICROSCOPIC
Bilirubin Urine: NEGATIVE
Glucose, UA: NEGATIVE mg/dL
Ketones, ur: NEGATIVE mg/dL
Leukocytes,Ua: NEGATIVE
Nitrite: NEGATIVE
Protein, ur: NEGATIVE mg/dL
Specific Gravity, Urine: 1.028 (ref 1.005–1.030)
pH: 5 (ref 5.0–8.0)

## 2024-02-03 LAB — HCG, SERUM, QUALITATIVE: Preg, Serum: NEGATIVE

## 2024-02-03 LAB — LIPASE, BLOOD: Lipase: 57 U/L — ABNORMAL HIGH (ref 11–51)

## 2024-02-03 NOTE — ED Triage Notes (Signed)
 Pt c/o RUQ abdominal pain since 2300 last night. Took 1g of tylenol without relief.

## 2024-02-03 NOTE — ED Notes (Signed)
 Pt stated her pain was gone, left AMA.

## 2024-02-28 ENCOUNTER — Other Ambulatory Visit: Payer: Self-pay | Admitting: Family Medicine

## 2024-02-28 ENCOUNTER — Other Ambulatory Visit: Payer: Self-pay

## 2024-02-28 ENCOUNTER — Other Ambulatory Visit (HOSPITAL_COMMUNITY): Payer: Self-pay

## 2024-02-28 MED ORDER — ONDANSETRON 4 MG PO TBDP
4.0000 mg | ORAL_TABLET | Freq: Three times a day (TID) | ORAL | 2 refills | Status: AC | PRN
  Filled 2024-02-28: qty 20, 7d supply, fill #0

## 2024-03-02 ENCOUNTER — Other Ambulatory Visit: Payer: Self-pay

## 2024-03-02 ENCOUNTER — Other Ambulatory Visit (HOSPITAL_COMMUNITY): Payer: Self-pay

## 2024-03-02 ENCOUNTER — Encounter: Payer: Self-pay | Admitting: Pharmacist

## 2024-03-02 ENCOUNTER — Other Ambulatory Visit: Payer: Self-pay | Admitting: Family Medicine

## 2024-03-04 ENCOUNTER — Other Ambulatory Visit (HOSPITAL_COMMUNITY): Payer: Self-pay

## 2024-03-04 ENCOUNTER — Other Ambulatory Visit: Payer: Self-pay

## 2024-03-04 MED ORDER — LISDEXAMFETAMINE DIMESYLATE 10 MG PO CAPS
10.0000 mg | ORAL_CAPSULE | Freq: Every day | ORAL | 0 refills | Status: AC
  Filled 2024-03-04: qty 30, 30d supply, fill #0

## 2024-03-05 ENCOUNTER — Other Ambulatory Visit: Payer: Self-pay
